# Patient Record
Sex: Male | Born: 1945 | Race: White | Hispanic: No | Marital: Married | Smoking: Never smoker
Health system: Southern US, Community
[De-identification: ages and names within clinical notes are randomized; demographics above are authoritative.]

## PROBLEM LIST (undated history)

## (undated) DIAGNOSIS — E7800 Pure hypercholesterolemia, unspecified: Secondary | ICD-10-CM

## (undated) DIAGNOSIS — K219 Gastro-esophageal reflux disease without esophagitis: Secondary | ICD-10-CM

## (undated) DIAGNOSIS — I10 Essential (primary) hypertension: Secondary | ICD-10-CM

---

## 2004-12-29 ENCOUNTER — Other Ambulatory Visit: Payer: Self-pay

## 2004-12-29 ENCOUNTER — Emergency Department: Payer: Self-pay | Admitting: Unknown Physician Specialty

## 2006-12-09 ENCOUNTER — Ambulatory Visit: Payer: Self-pay | Admitting: Internal Medicine

## 2006-12-14 ENCOUNTER — Ambulatory Visit: Payer: Self-pay | Admitting: Emergency Medicine

## 2008-09-01 IMAGING — CR SKULL - 1-3 VIEW
1 series · 3 of 3 positions shown · non-contrast
Comparison: none

REASON FOR EXAM: rule out auto mirrow glass fb
COMMENTS:

[Series 1: view not recorded · 0.17mm/px · 3 of 3 slices shown]
[im 1/3]
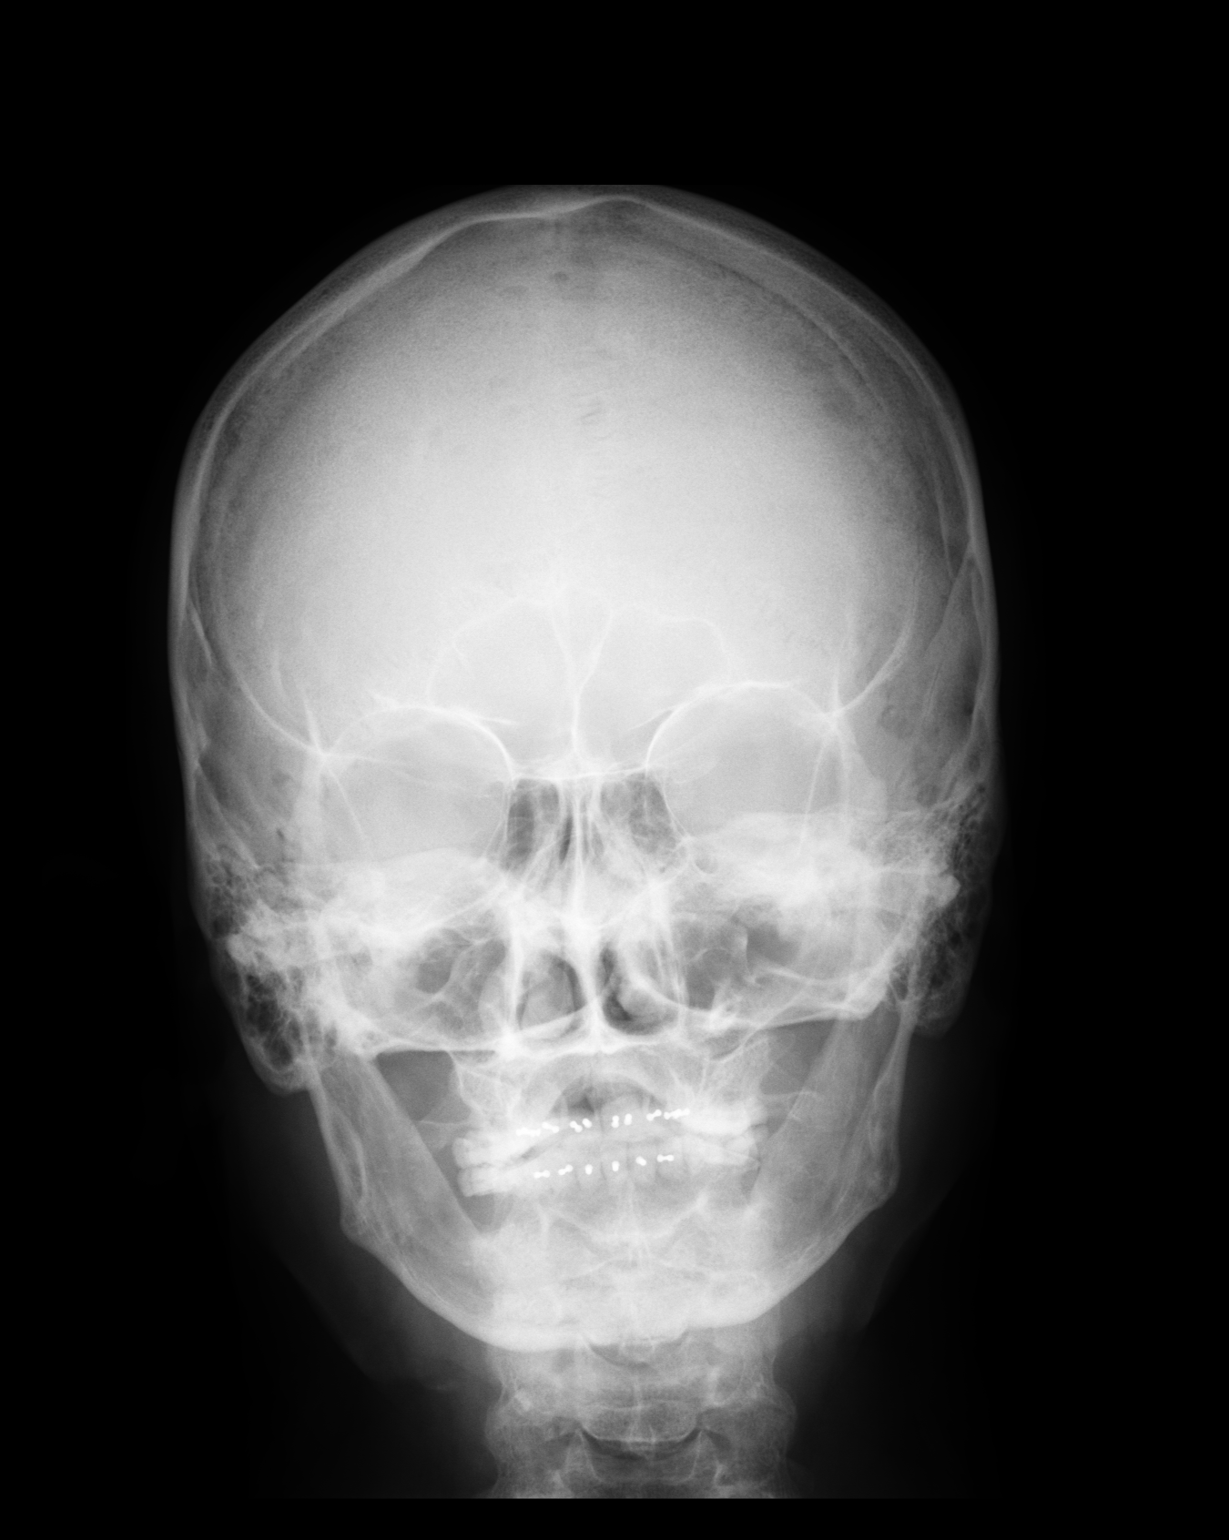
[im 2/3]
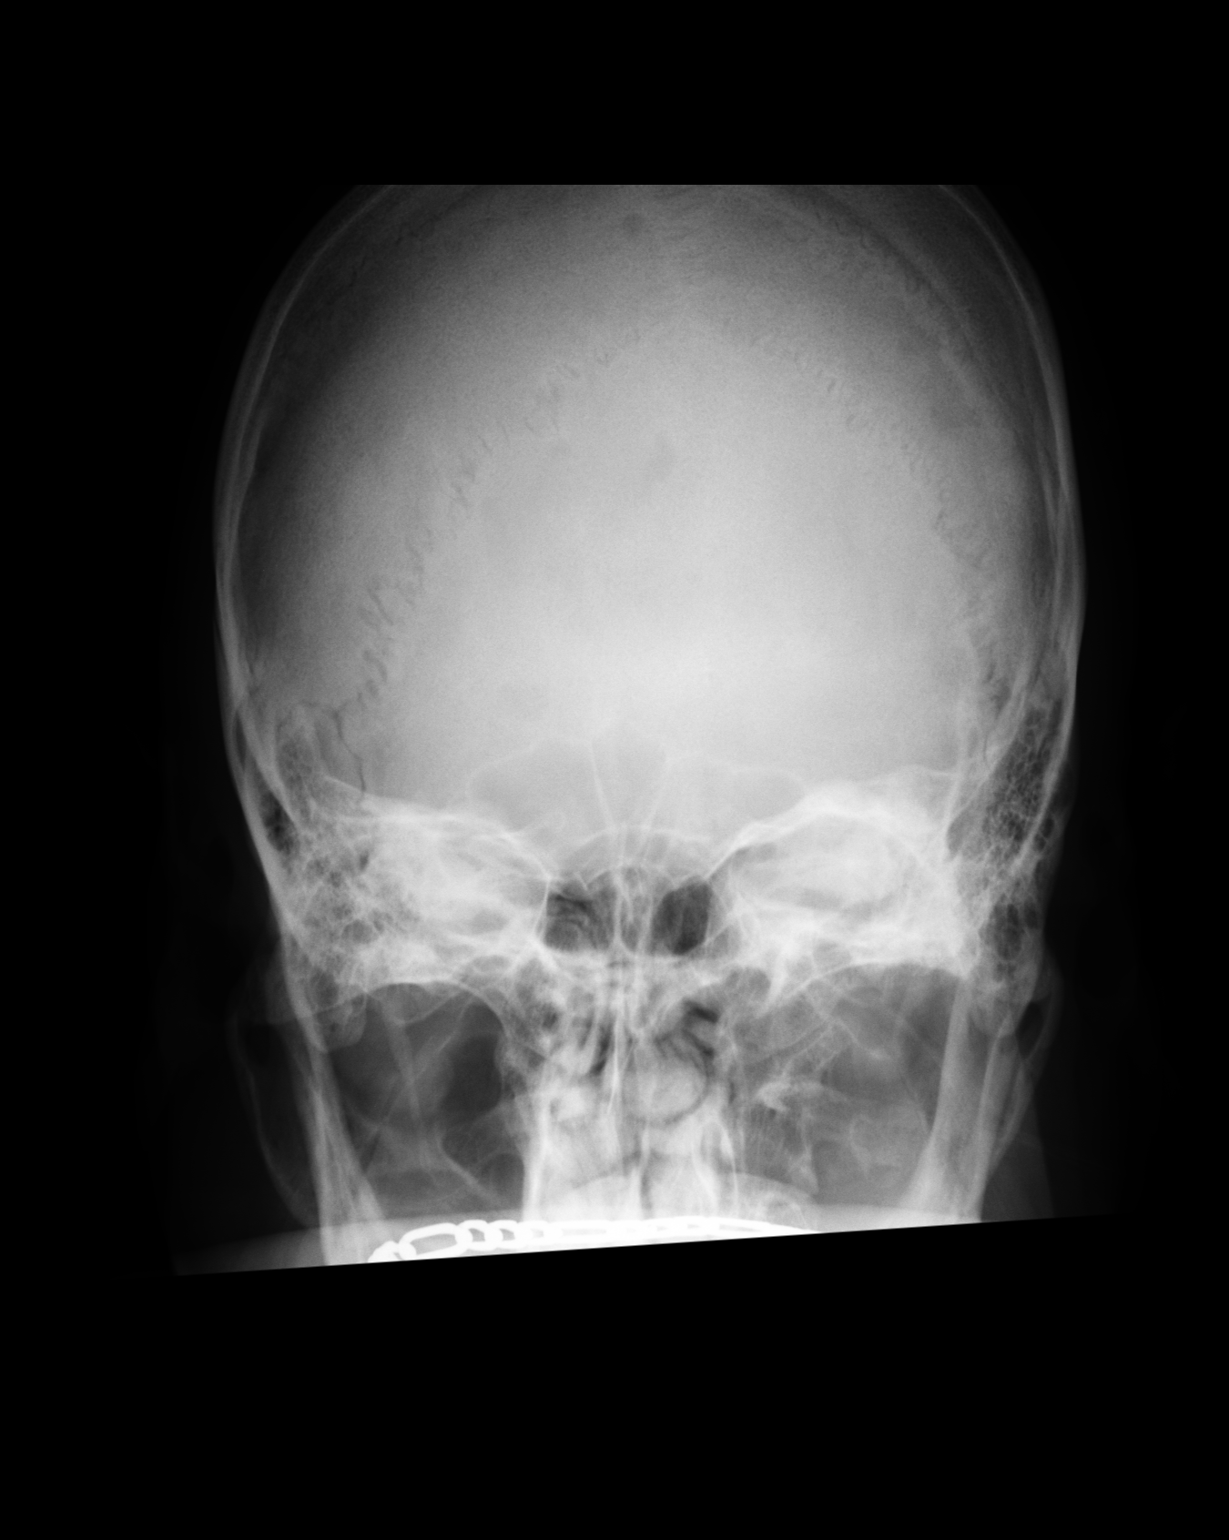
[im 3/3]
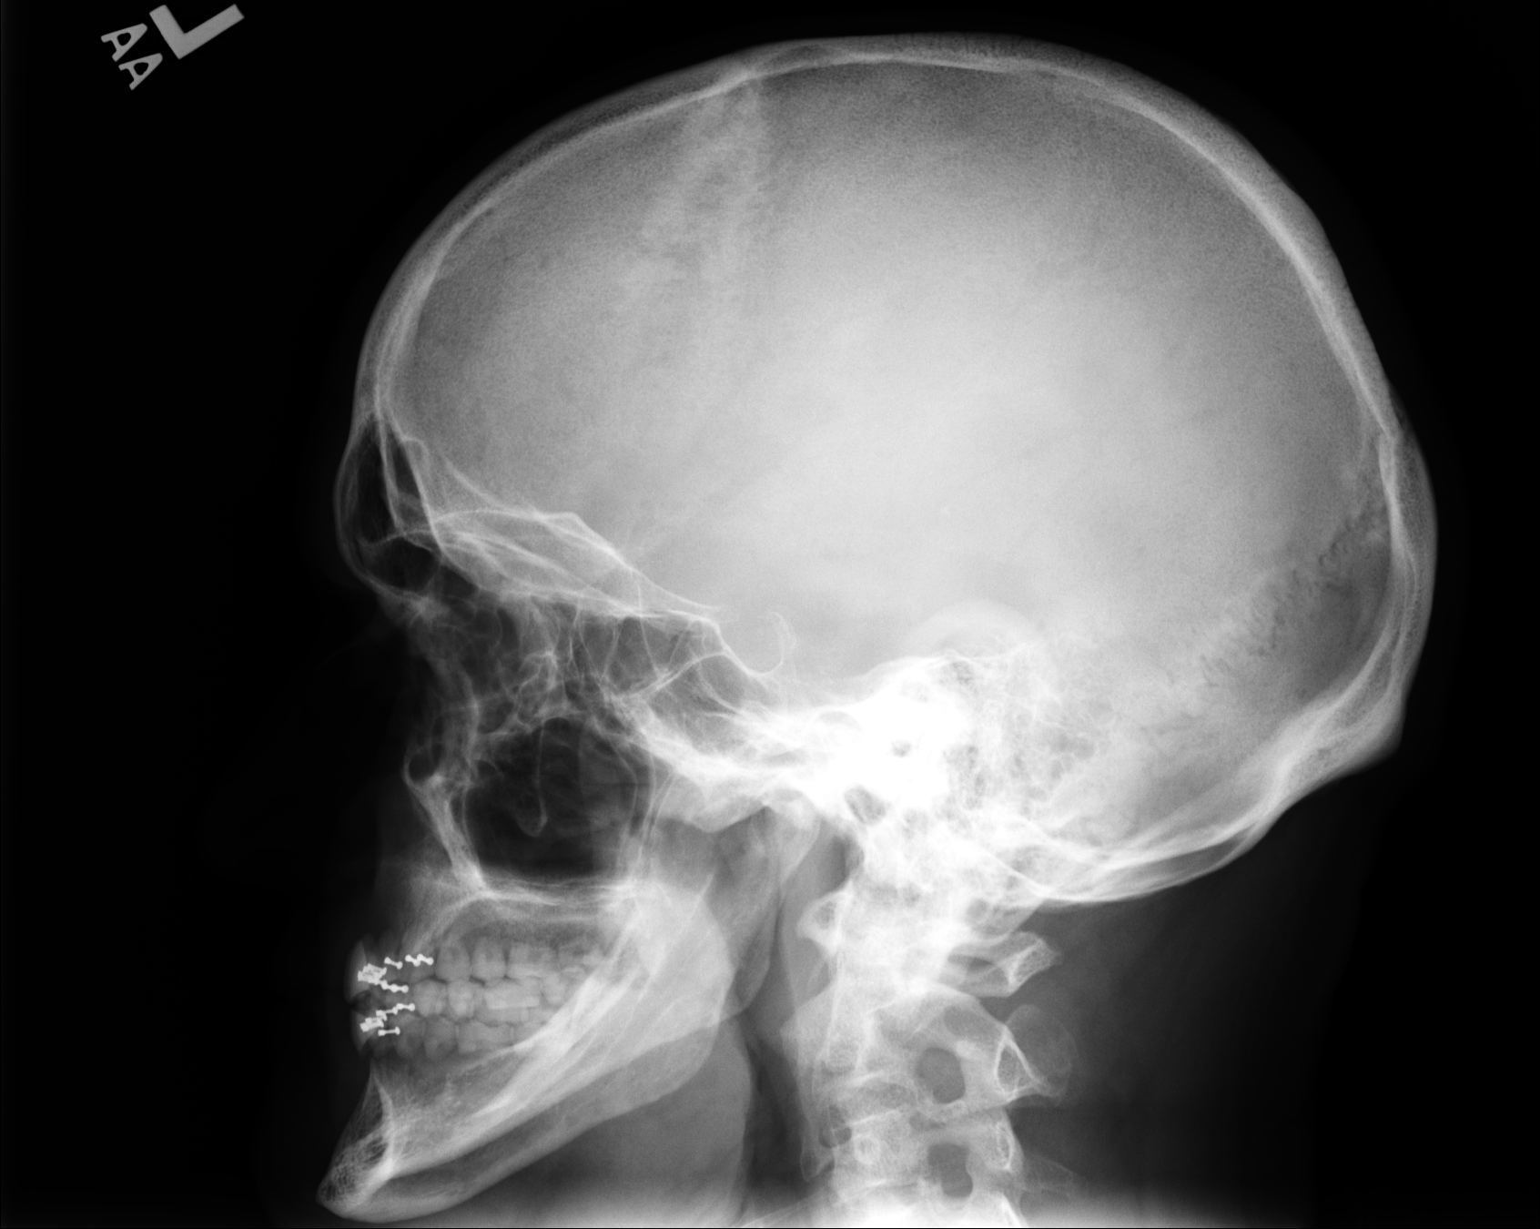

[3 of 3 positions shown; findings below may reference images not displayed]

PROCEDURE:     MDR - MDR SKULL PARTIAL  - December 09, 2006  [DATE]

RESULT:     Three views of the skull were obtained. No fracture or other
acute bony abnormality is seen. Attention to the soft tissue shows no
definite soft tissue foreign body. The views were obtained at skull
technique and the soft tissues are somewhat overpenetrated. Consequently, a
small or poorly opacified foreign body could be present and not detected. If
symptomatology persists, a follow-up examination with soft tissue technique
could be performed to further evaluate the soft tissues about the skull.
IMPRESSION: 1. No significant abnormalities are noted.
2. No foreign body is observed in the soft tissues but the soft tissues are
somewhat overpenetrated and therefore follow-up exam with soft tissue
technique would be recommended if symptomatology persists.

## 2010-07-15 ENCOUNTER — Emergency Department: Payer: Self-pay | Admitting: Emergency Medicine

## 2012-04-07 IMAGING — CR DG CHEST 1V PORT
1 series · 1 of 1 positions shown · non-contrast
Comparison: none

REASON FOR EXAM: chest pain
COMMENTS:

PROCEDURE:     DXR - DXR PORTABLE CHEST SINGLE VIEW  - July 15, 2010  [DATE]
RESULT:     Comparison: None.

[view not recorded]
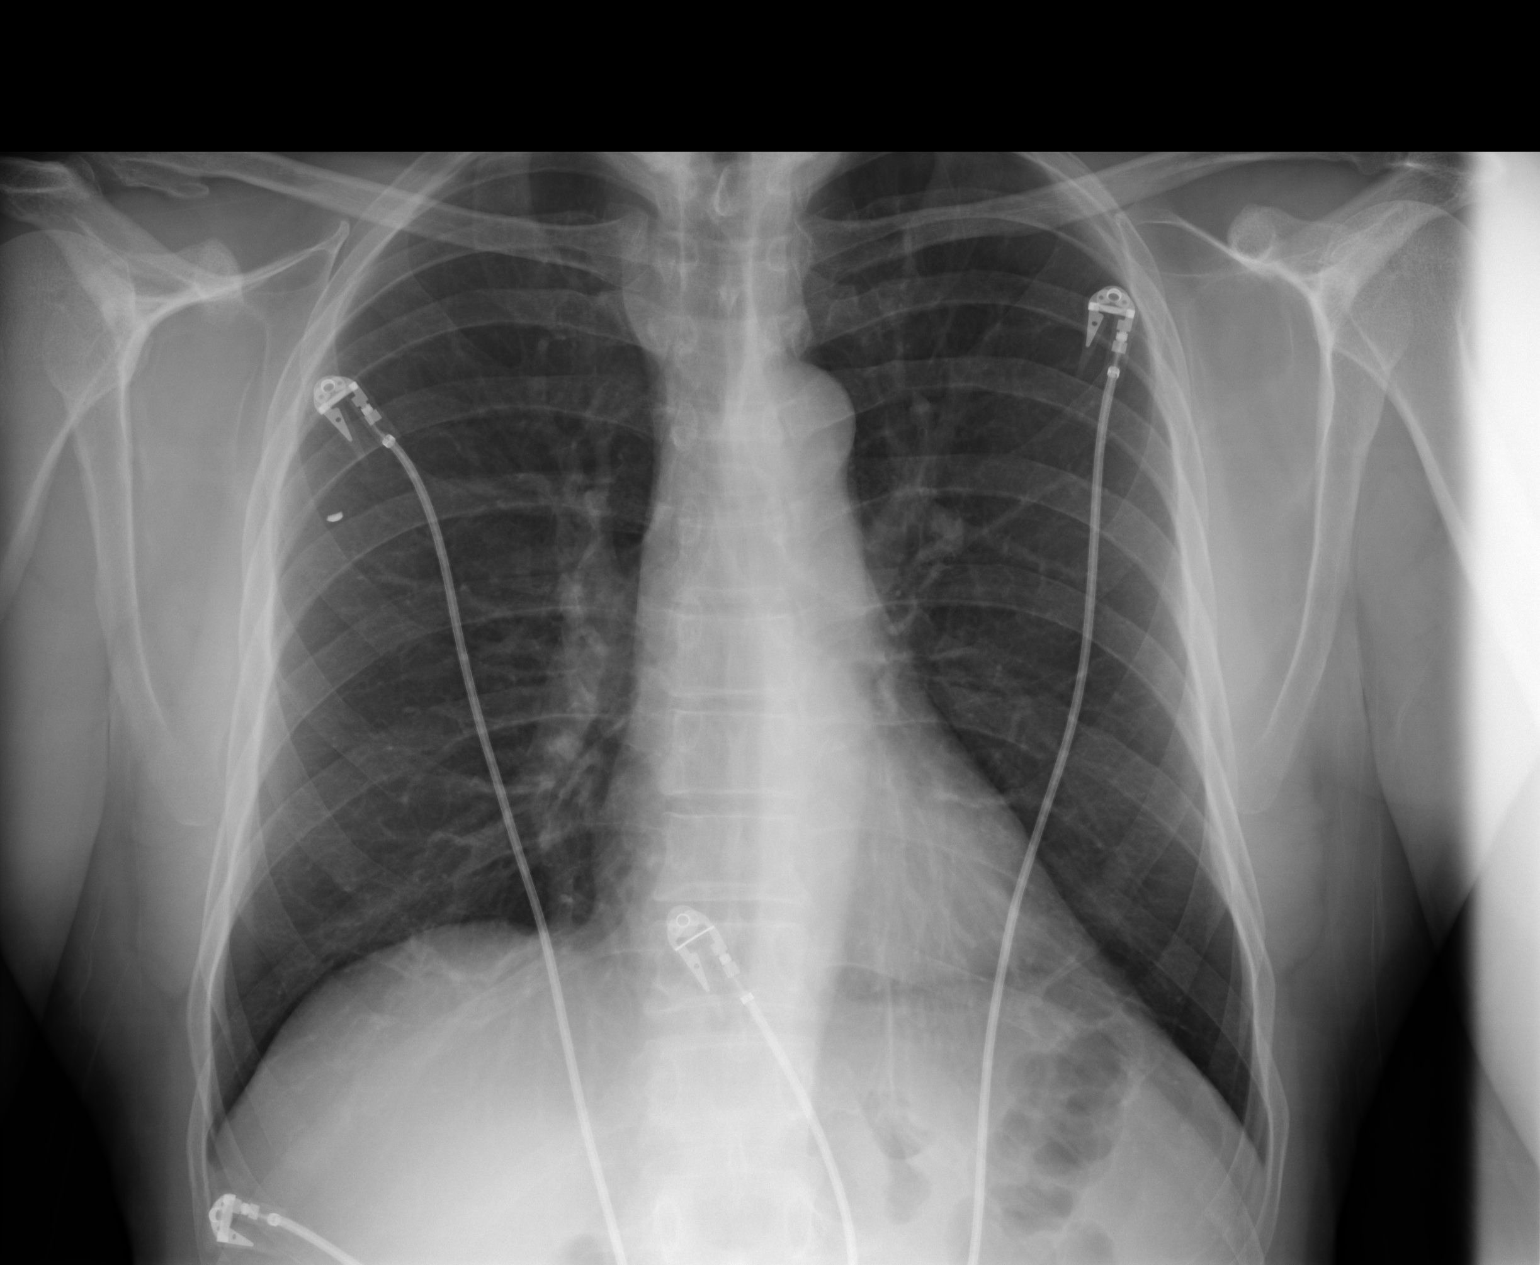

[1 of 1 positions shown; findings below may reference images not displayed]

FINDINGS: Heart normal size. There is mild prominence of right superior mediastinum
likely related to tortuous great vessels. The lungs are clear. Small round
density just superior to the left hilum is felt to represent a vessel on
end. There is a small metallic density overlying the lateral right
hemithorax which may be artifactual or external to patient. Correlate
clinically.
IMPRESSION: 1. No acute cardio pulmonary disease.
2. Mild prominence the right superior mediastinum likely to tortuous great
vessels.  However, recommend comparison with outside prior chest radiographs
and/or followup PA and lateral chest radiographs to ensure stability.

## 2012-04-07 IMAGING — CT CT HEAD WITHOUT CONTRAST
2 series · 16 of 30 positions shown, 20 images · non-contrast
Comparison: none

REASON FOR EXAM: hypertension, dizziness
COMMENTS:

PROCEDURE:     CT  - CT HEAD WITHOUT CONTRAST  - July 15, 2010  [DATE]
RESULT:     History: Dizziness and high blood pressure.
Comparison Study: No recent.

[Series 2: without · axial · non-contrast · 0.39mm/px · z∈[+396,+516]mm · 13 of 29 slices shown, 17 images]
[im 3/29  brain]
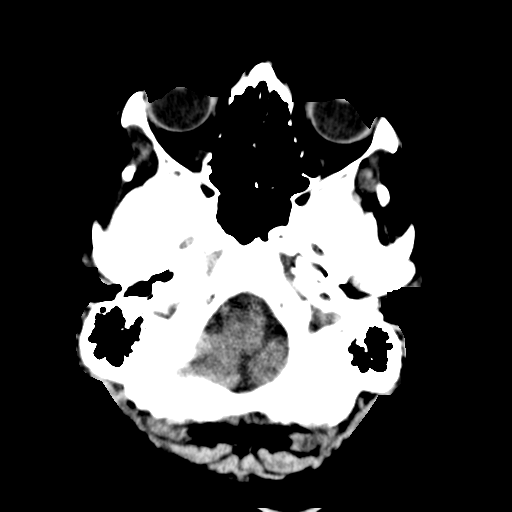
[im 3/29  bone]
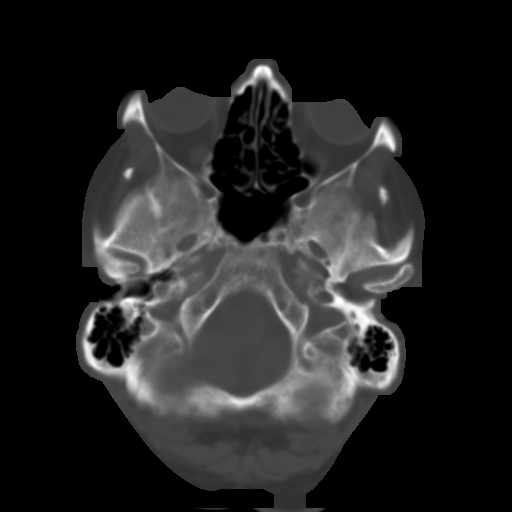
[im 5/29  brain]
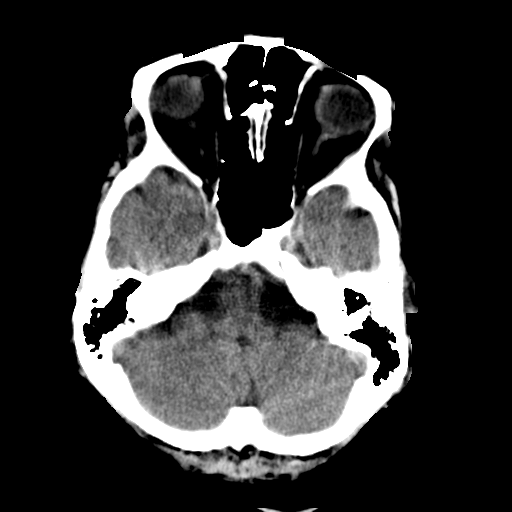
[im 7/29  brain]
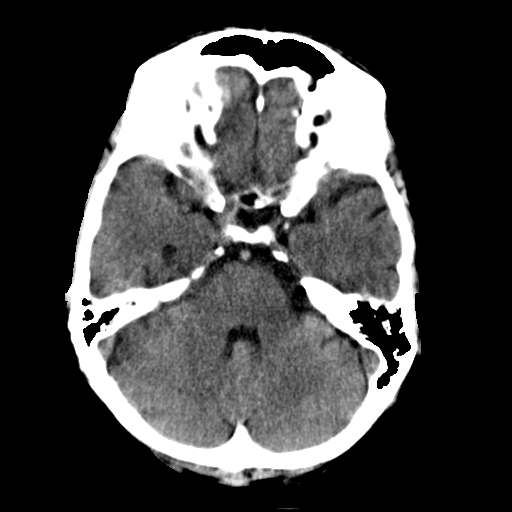
[im 9/29  brain]
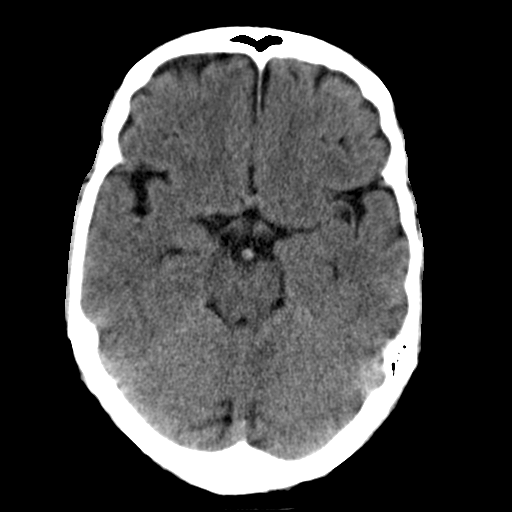
[im 11/29  brain]
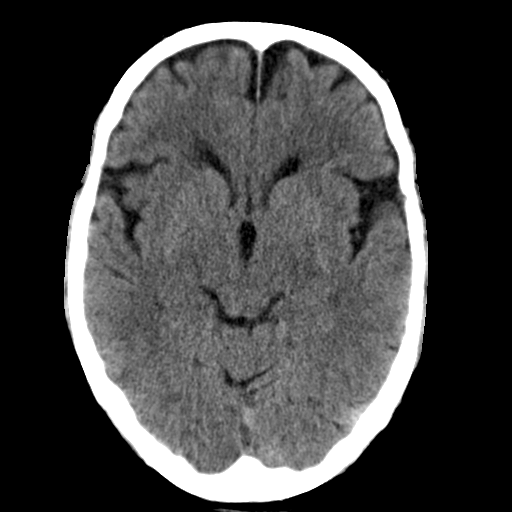
[im 11/29  bone]
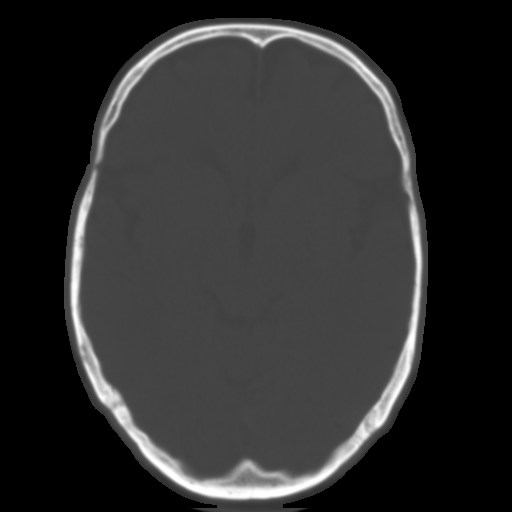
[im 13/29  brain]
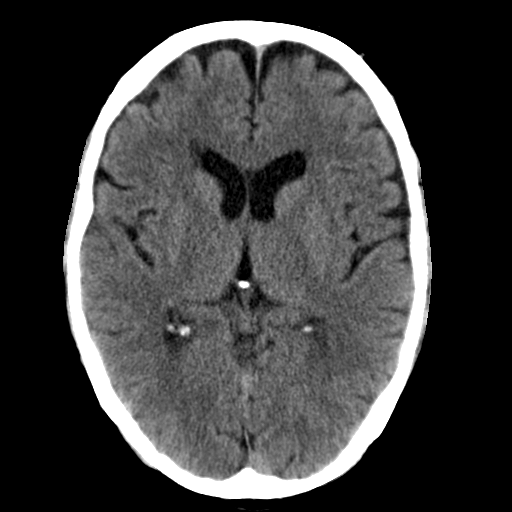
[im 15/29  brain]
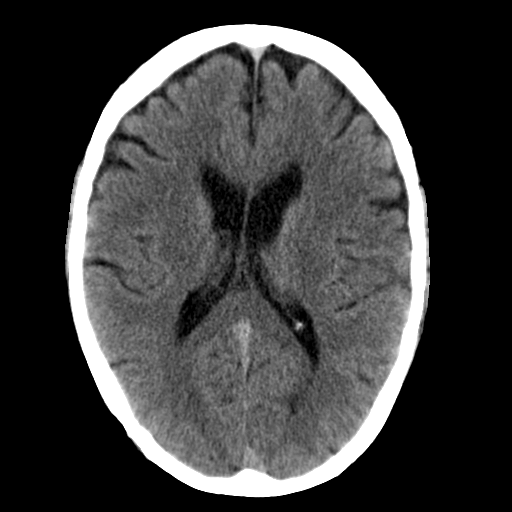
[im 17/29  brain]
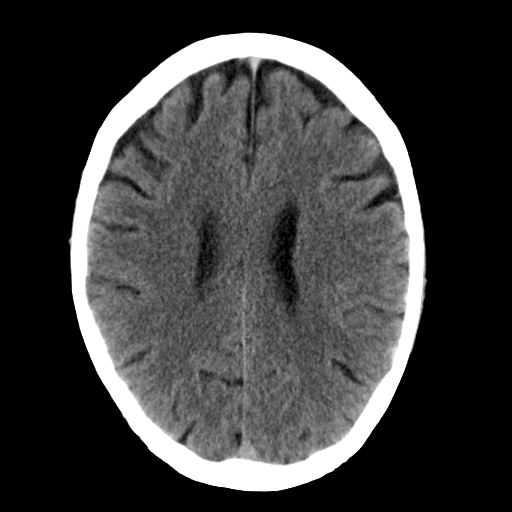
[im 19/29  brain]
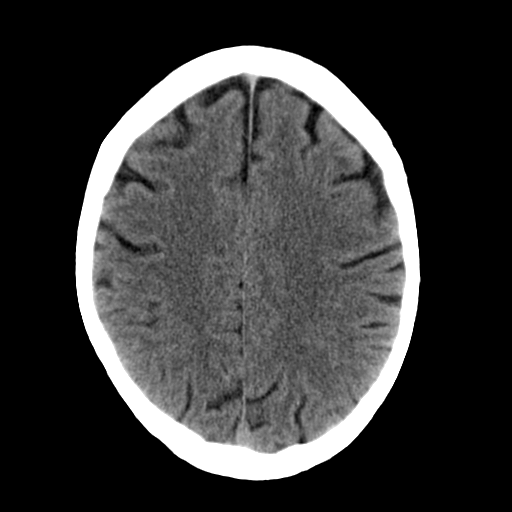
[im 19/29  bone]
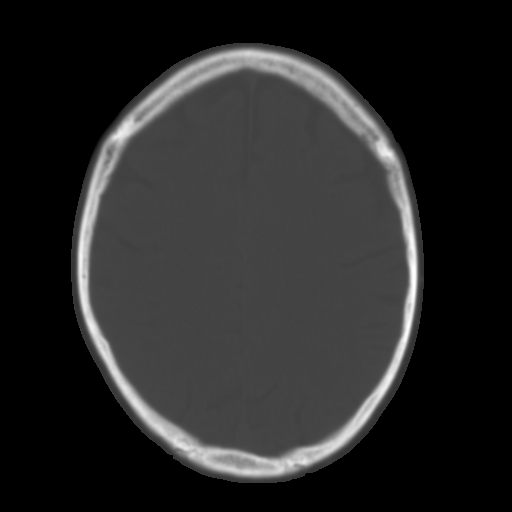
[im 21/29  brain]
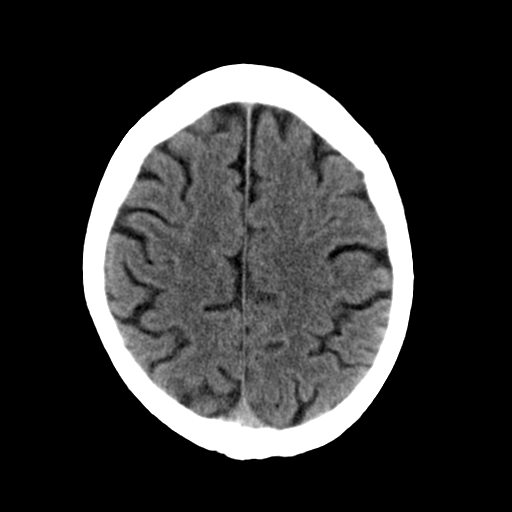
[im 23/29  brain]
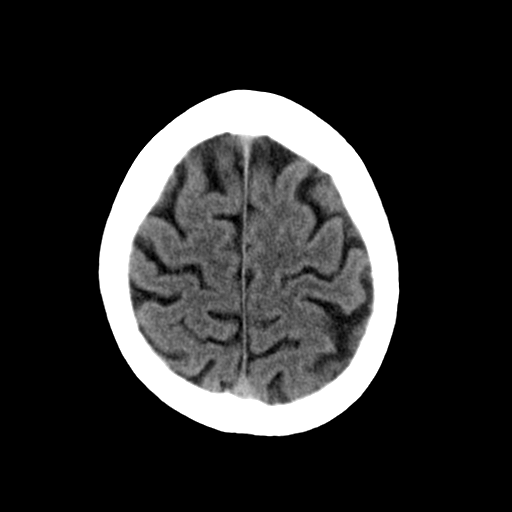
[im 25/29  brain]
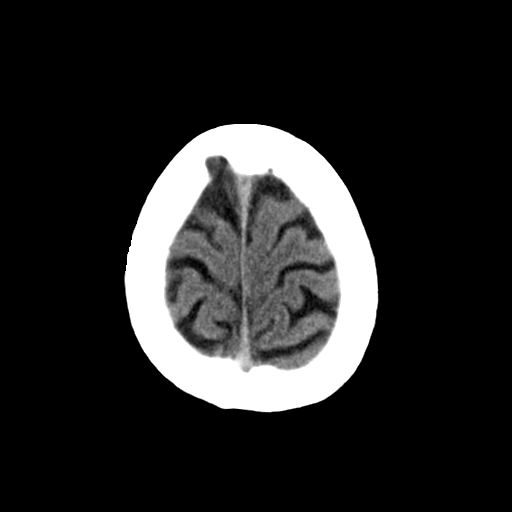
[im 27/29  brain]
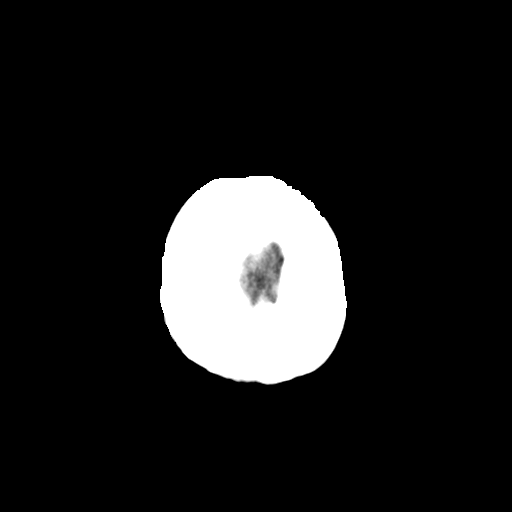
[im 27/29  bone]
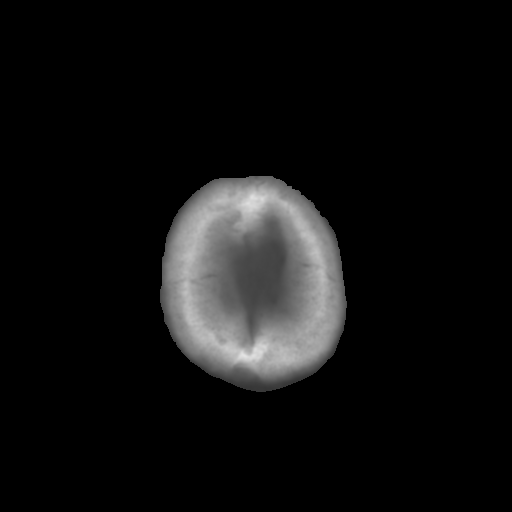

[Series 3: bone · axial · 0.39mm/px · z∈[+396,+436]mm · 3 of 29 slices shown]
[im 3/29  bone]
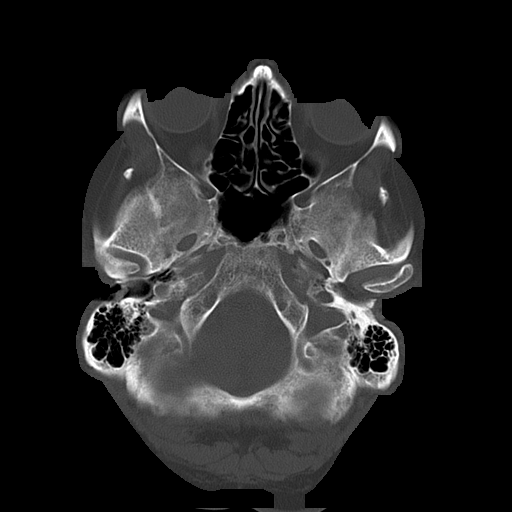
[im 7/29  bone]
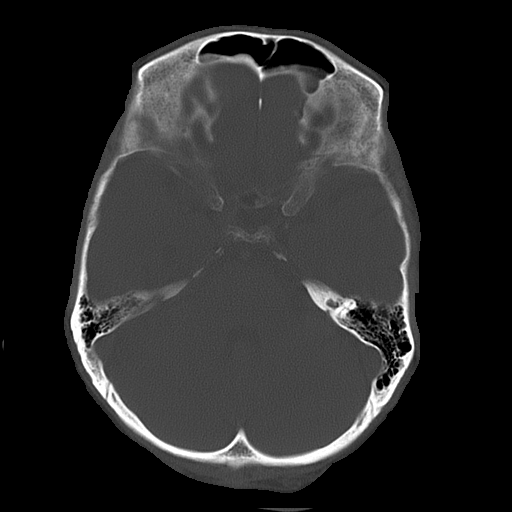
[im 11/29  bone]
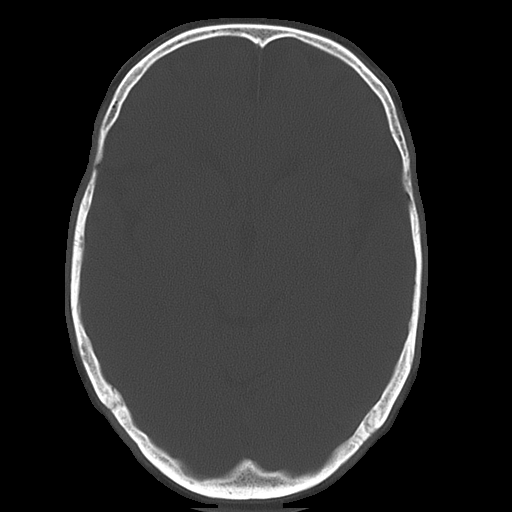

[16 of 30 positions shown; findings below may reference images not displayed]

FINDINGS: No mass. No hydrocephalus. No hemorrhage. No acute bony
abnormality identified.
IMPRESSION: No acute abnormality.

## 2013-06-12 ENCOUNTER — Ambulatory Visit: Payer: Self-pay | Admitting: Gastroenterology

## 2013-06-15 LAB — PATHOLOGY REPORT

## 2017-11-05 ENCOUNTER — Encounter: Payer: Self-pay | Admitting: *Deleted

## 2017-11-12 ENCOUNTER — Ambulatory Visit
Admission: RE | Admit: 2017-11-12 | Discharge: 2017-11-12 | Disposition: A | Discharge status: Home / Self Care | Payer: Medicare Other | Source: Ambulatory Visit | Attending: Ophthalmology | Admitting: Ophthalmology

## 2017-11-12 ENCOUNTER — Ambulatory Visit: Payer: Medicare Other | Admitting: Anesthesiology

## 2017-11-12 ENCOUNTER — Encounter
Admission: RE | Disposition: A | Discharge status: Home / Self Care | Payer: Self-pay | Source: Ambulatory Visit | Attending: Ophthalmology

## 2017-11-12 ENCOUNTER — Other Ambulatory Visit: Payer: Self-pay

## 2017-11-12 ENCOUNTER — Encounter: Payer: Self-pay | Admitting: Anesthesiology

## 2017-11-12 DIAGNOSIS — K219 Gastro-esophageal reflux disease without esophagitis: Secondary | ICD-10-CM | POA: Insufficient documentation

## 2017-11-12 DIAGNOSIS — E7800 Pure hypercholesterolemia, unspecified: Secondary | ICD-10-CM | POA: Insufficient documentation

## 2017-11-12 DIAGNOSIS — I10 Essential (primary) hypertension: Secondary | ICD-10-CM | POA: Insufficient documentation

## 2017-11-12 DIAGNOSIS — H2511 Age-related nuclear cataract, right eye: Secondary | ICD-10-CM | POA: Insufficient documentation

## 2017-11-12 DIAGNOSIS — Z79899 Other long term (current) drug therapy: Secondary | ICD-10-CM | POA: Insufficient documentation

## 2017-11-12 HISTORY — DX: Essential (primary) hypertension: I10

## 2017-11-12 HISTORY — PX: CATARACT EXTRACTION W/PHACO: SHX586

## 2017-11-12 HISTORY — DX: Pure hypercholesterolemia, unspecified: E78.00

## 2017-11-12 HISTORY — DX: Gastro-esophageal reflux disease without esophagitis: K21.9

## 2017-11-12 SURGERY — PHACOEMULSIFICATION, CATARACT, WITH IOL INSERTION
Anesthesia: Monitor Anesthesia Care | Site: Eye | Laterality: Right | Wound class: "Clean "

## 2017-11-12 MED ORDER — TETRACAINE HCL 0.5 % OP SOLN: 1.0000 [drp] | Freq: Once | OPHTHALMIC | Status: DC

## 2017-11-12 MED ORDER — ARMC OPHTHALMIC DILATING DROPS
1.0000 "application " | OPHTHALMIC | Status: AC
  Administered 2017-11-12 (×3): 1 via OPHTHALMIC

## 2017-11-12 MED ORDER — LIDOCAINE HCL (PF) 4 % IJ SOLN
INTRAOCULAR | Status: DC | PRN
  Administered 2017-11-12: 2 mL via OPHTHALMIC

## 2017-11-12 MED ORDER — EPINEPHRINE PF 1 MG/ML IJ SOLN
INTRAOCULAR | Status: DC | PRN
  Administered 2017-11-12: 1 mL via OPHTHALMIC

## 2017-11-12 MED ORDER — MOXIFLOXACIN HCL 0.5 % OP SOLN
OPHTHALMIC | Status: DC | PRN
  Administered 2017-11-12: .2 mL via OPHTHALMIC

## 2017-11-12 MED ORDER — PHENYLEPHRINE HCL 10 % OP SOLN: 1.0000 [drp] | OPHTHALMIC | Status: DC | PRN

## 2017-11-12 MED ORDER — ARMC OPHTHALMIC DILATING DROPS
OPHTHALMIC | Status: AC
  Administered 2017-11-12: 1 via OPHTHALMIC
  Filled 2017-11-12: qty 0.5

## 2017-11-12 MED ORDER — MIDAZOLAM HCL 2 MG/2ML IJ SOLN
INTRAMUSCULAR | Status: AC
  Filled 2017-11-12: qty 2

## 2017-11-12 MED ORDER — MOXIFLOXACIN HCL 0.5 % OP SOLN
OPHTHALMIC | Status: AC
  Filled 2017-11-12: qty 3

## 2017-11-12 MED ORDER — MIDAZOLAM HCL 2 MG/2ML IJ SOLN
INTRAMUSCULAR | Status: DC | PRN
  Administered 2017-11-12 (×2): 1 mg via INTRAVENOUS

## 2017-11-12 MED ORDER — POVIDONE-IODINE 5 % OP SOLN
OPHTHALMIC | Status: DC | PRN
  Administered 2017-11-12: 1 via OPHTHALMIC

## 2017-11-12 MED ORDER — MOXIFLOXACIN HCL 0.5 % OP SOLN: 1.0000 [drp] | Freq: Once | OPHTHALMIC | Status: DC

## 2017-11-12 MED ORDER — SODIUM CHLORIDE 0.9 % IV SOLN
INTRAVENOUS | Status: DC
  Administered 2017-11-12: 11:00:00 via INTRAVENOUS

## 2017-11-12 MED ORDER — CARBACHOL 0.01 % IO SOLN
INTRAOCULAR | Status: DC | PRN
  Administered 2017-11-12: .5 mL via INTRAOCULAR

## 2017-11-12 MED ORDER — NA CHONDROIT SULF-NA HYALURON 40-17 MG/ML IO SOLN
INTRAOCULAR | Status: DC | PRN
  Administered 2017-11-12: 1 mL via INTRAOCULAR

## 2017-11-12 SURGICAL SUPPLY — 16 items
GLOVE BIO SURGEON STRL SZ8 (GLOVE) ×3 IMPLANT
GLOVE BIOGEL M 6.5 STRL (GLOVE) ×3 IMPLANT
GLOVE SURG LX 8.0 MICRO (GLOVE) ×2
GLOVE SURG LX STRL 8.0 MICRO (GLOVE) ×1 IMPLANT
GOWN STRL REUS W/ TWL LRG LVL3 (GOWN DISPOSABLE) ×2 IMPLANT
GOWN STRL REUS W/TWL LRG LVL3 (GOWN DISPOSABLE) ×4
LABEL CATARACT MEDS ST (LABEL) ×3 IMPLANT
LENS IOL TECNIS ITEC 20.5 (Intraocular Lens) ×2 IMPLANT
PACK CATARACT (MISCELLANEOUS) ×3 IMPLANT
PACK CATARACT BRASINGTON LX (MISCELLANEOUS) ×3 IMPLANT
PACK EYE AFTER SURG (MISCELLANEOUS) ×3 IMPLANT
SOL BSS BAG (MISCELLANEOUS) ×3
SOLUTION BSS BAG (MISCELLANEOUS) ×1 IMPLANT
SYR 5ML LL (SYRINGE) ×3 IMPLANT
WATER STERILE IRR 250ML POUR (IV SOLUTION) ×3 IMPLANT
WIPE NON LINTING 3.25X3.25 (MISCELLANEOUS) ×3 IMPLANT

## 2017-11-12 NOTE — Op Note (Signed)
PREOPERATIVE DIAGNOSIS:  Nuclear sclerotic cataract of the right eye.   POSTOPERATIVE DIAGNOSIS:  nuclear sclerotic cataract right eye   OPERATIVE PROCEDURE: Procedure(s): CATARACT EXTRACTION PHACO AND INTRAOCULAR LENS PLACEMENT (IOC)   SURGEON:  Birder Robson, MD.   ANESTHESIA:  Anesthesiologist: Gunnar Bulla, MD CRNA: Eben Burow, CRNA  1.      Managed anesthesia care. 2.      0.71ml of Shugarcaine was instilled in the eye following the paracentesis.   COMPLICATIONS:  None.   TECHNIQUE:   Stop and chop   DESCRIPTION OF PROCEDURE:  The patient was examined and consented in the preoperative holding area where the aforementioned topical anesthesia was applied to the right eye and then brought back to the Operating Room where the right eye was prepped and draped in the usual sterile ophthalmic fashion and a lid speculum was placed. A paracentesis was created with the side port blade and the anterior chamber was filled with viscoelastic. A near clear corneal incision was performed with the steel keratome. A continuous curvilinear capsulorrhexis was performed with a cystotome followed by the capsulorrhexis forceps. Hydrodissection and hydrodelineation were carried out with BSS on a blunt cannula. The lens was removed in a stop and chop  technique and the remaining cortical material was removed with the irrigation-aspiration handpiece. The capsular bag was inflated with viscoelastic and the Technis ZCB00  lens was placed in the capsular bag without complication. The remaining viscoelastic was removed from the eye with the irrigation-aspiration handpiece. The wounds were hydrated. The anterior chamber was flushed with Miostat and the eye was inflated to physiologic pressure. 0.74ml of Vigamox was placed in the anterior chamber. The wounds were found to be water tight. The eye was dressed with Vigamox. The patient was given protective glasses to wear throughout the day and a shield with which to  sleep tonight. The patient was also given drops with which to begin a drop regimen today and will follow-up with me in one day. Implant Name Type Inv. Item Serial No. Manufacturer Lot No. LRB No. Used  LENS IOL DIOP 20.5 - [id number] Intraocular Lens LENS IOL DIOP 20.5 [id number] AMO  Right 1   Procedure(s) with comments: CATARACT EXTRACTION PHACO AND INTRAOCULAR LENS PLACEMENT ([facility]) (Right) - [place] 00:36 AP% 16.9 CDE 6.23 Fluid pack lot # [id number]  Electronically signed: Birder Robson 11/12/2017 11:13 AM

## 2017-11-12 NOTE — H&P (Signed)
All labs reviewed. Abnormal studies sent to patients PCP when indicated.  Previous H&P reviewed, patient examined, there are NO CHANGES.  Joel Najjar Porfilio9/10/201911:12 AM

## 2017-11-12 NOTE — Anesthesia Preprocedure Evaluation (Addendum)
Anesthesia Evaluation  Patient identified by MRN, date of birth, ID band Patient awake    Reviewed: Allergy & Precautions, NPO status , Patient's Chart, lab work & pertinent test results, reviewed documented beta blocker date and time   Airway Mallampati: II  TM Distance: >3 FB     Dental  (+) Upper Dentures, Lower Dentures   Pulmonary           Cardiovascular hypertension, Pt. on medications      Neuro/Psych    GI/Hepatic   Endo/Other    Renal/GU      Musculoskeletal   Abdominal   Peds  Hematology   Anesthesia Other Findings   Reproductive/Obstetrics                            Anesthesia Physical Anesthesia Plan  ASA: II  Anesthesia Plan: MAC   Post-op Pain Management:    Induction:   PONV Risk Score and Plan:   Airway Management Planned:   Additional Equipment:   Intra-op Plan:   Post-operative Plan:   Informed Consent: I have reviewed the patients History and Physical, chart, labs and discussed the procedure including the risks, benefits and alternatives for the proposed anesthesia with the patient or authorized representative who has indicated his/her understanding and acceptance.     Plan Discussed with: CRNA  Anesthesia Plan Comments:         Anesthesia Quick Evaluation

## 2017-11-12 NOTE — Transfer of Care (Signed)
Immediate Anesthesia Transfer of Care Note  Patient: Joel Patterson  Procedure(s) Performed: CATARACT EXTRACTION PHACO AND INTRAOCULAR LENS PLACEMENT ([facility]) (Right Eye)  Patient Location: Short Stay  Anesthesia Type:MAC  Level of Consciousness: awake, alert , oriented and patient cooperative  Airway & Oxygen Therapy: Patient Spontanous Breathing  Post-op Assessment: Report given to RN and Post -op Vital signs reviewed and stable  Post vital signs: Reviewed and stable  Last Vitals:  Vitals Value Taken Time  BP    Temp    Pulse    Resp    SpO2      Last Pain:  Vitals:   11/12/17 1038  TempSrc: Temporal  PainSc: 0-No pain         Complications: No apparent anesthesia complications

## 2017-11-12 NOTE — Anesthesia Postprocedure Evaluation (Signed)
Anesthesia Post Note  Patient: Joel Patterson  Procedure(s) Performed: CATARACT EXTRACTION PHACO AND INTRAOCULAR LENS PLACEMENT ([facility]) (Right Eye)  Patient location during evaluation: Short Stay Anesthesia Type: MAC Level of consciousness: awake and alert, oriented and patient cooperative Pain management: pain level controlled Vital Signs Assessment: post-procedure vital signs reviewed and stable Respiratory status: spontaneous breathing, respiratory function stable and nonlabored ventilation Cardiovascular status: blood pressure returned to baseline and stable Postop Assessment: no headache and no apparent nausea or vomiting Anesthetic complications: no     Last Vitals:  Vitals:   11/12/17 1038 11/12/17 1114  BP: (!) 163/85 (!) 142/72  Pulse: (!) 48 (!) 49  Resp: 17 16  Temp: (!) 35.7 C (!) 36.2 C  SpO2: 99% 99%    Last Pain:  Vitals:   11/12/17 1114  TempSrc: Temporal  PainSc: 0-No pain                 Eben Burow

## 2017-11-12 NOTE — Anesthesia Post-op Follow-up Note (Signed)
Anesthesia QCDR form completed.        

## 2017-11-12 NOTE — Discharge Instructions (Addendum)
Eye Surgery Discharge Instructions  Expect mild scratchy sensation or mild soreness. DO NOT RUB YOUR EYE!  The day of surgery:  Minimal physical activity, but bed rest is not required  No reading, computer work, or close hand work  No bending, lifting, or straining.  May watch TV  For 24 hours:  No driving, legal decisions, or alcoholic beverages  Safety precautions  Eat anything you prefer: It is better to start with liquids, then soup then solid foods.  Solar shield eyeglasses should be worn for comfort in the sunlight/patch while sleeping  Resume all regular medications including aspirin or Coumadin if these were discontinued prior to surgery. You may shower, bathe, shave, or wash your hair. Tylenol may be taken for mild discomfort. FOLLOW DR. PORFILIO'S EYE DROP INSTRUCTION SHEET AS REVIEWED.  Call your doctor if you experience significant pain, nausea, or vomiting, fever > 101 or other signs of infection. [phone] or [phone] Specific instructions:  Follow-up Information    Birder Robson, MD Follow up.   Specialty:  Ophthalmology Why:  11/13/17 @ 10:10 am Contact information: [address] [place] [address] [phone]

## 2017-11-13 ENCOUNTER — Encounter: Payer: Self-pay | Admitting: Ophthalmology

## 2018-01-01 ENCOUNTER — Encounter: Payer: Self-pay | Admitting: *Deleted

## 2018-01-07 ENCOUNTER — Encounter
Admission: RE | Disposition: A | Discharge status: Home / Self Care | Payer: Self-pay | Source: Ambulatory Visit | Attending: Ophthalmology

## 2018-01-07 ENCOUNTER — Ambulatory Visit: Payer: Medicare Other | Admitting: Anesthesiology

## 2018-01-07 ENCOUNTER — Other Ambulatory Visit: Payer: Self-pay

## 2018-01-07 ENCOUNTER — Ambulatory Visit
Admission: RE | Admit: 2018-01-07 | Discharge: 2018-01-07 | Disposition: A | Discharge status: Home / Self Care | Payer: Medicare Other | Source: Ambulatory Visit | Attending: Ophthalmology | Admitting: Ophthalmology

## 2018-01-07 DIAGNOSIS — I10 Essential (primary) hypertension: Secondary | ICD-10-CM | POA: Insufficient documentation

## 2018-01-07 DIAGNOSIS — K219 Gastro-esophageal reflux disease without esophagitis: Secondary | ICD-10-CM | POA: Diagnosis not present

## 2018-01-07 DIAGNOSIS — Z9841 Cataract extraction status, right eye: Secondary | ICD-10-CM | POA: Insufficient documentation

## 2018-01-07 DIAGNOSIS — Z79899 Other long term (current) drug therapy: Secondary | ICD-10-CM | POA: Insufficient documentation

## 2018-01-07 DIAGNOSIS — H2512 Age-related nuclear cataract, left eye: Secondary | ICD-10-CM | POA: Diagnosis not present

## 2018-01-07 DIAGNOSIS — E7800 Pure hypercholesterolemia, unspecified: Secondary | ICD-10-CM | POA: Insufficient documentation

## 2018-01-07 HISTORY — PX: CATARACT EXTRACTION W/PHACO: SHX586

## 2018-01-07 SURGERY — PHACOEMULSIFICATION, CATARACT, WITH IOL INSERTION
Anesthesia: Monitor Anesthesia Care | Site: Eye | Laterality: Left

## 2018-01-07 MED ORDER — EPINEPHRINE PF 1 MG/ML IJ SOLN
INTRAMUSCULAR | Status: AC
  Filled 2018-01-07: qty 1

## 2018-01-07 MED ORDER — LIDOCAINE HCL (PF) 4 % IJ SOLN
INTRAMUSCULAR | Status: AC
  Filled 2018-01-07: qty 5

## 2018-01-07 MED ORDER — LIDOCAINE HCL (PF) 4 % IJ SOLN
INTRAOCULAR | Status: DC | PRN
  Administered 2018-01-07: 2 mL via OPHTHALMIC

## 2018-01-07 MED ORDER — POVIDONE-IODINE 5 % OP SOLN
OPHTHALMIC | Status: DC | PRN
  Administered 2018-01-07: 1 via OPHTHALMIC

## 2018-01-07 MED ORDER — MOXIFLOXACIN HCL 0.5 % OP SOLN: 1.0000 [drp] | OPHTHALMIC | Status: DC | PRN

## 2018-01-07 MED ORDER — MIDAZOLAM HCL 2 MG/2ML IJ SOLN
INTRAMUSCULAR | Status: DC | PRN
  Administered 2018-01-07 (×2): 1 mg via INTRAVENOUS

## 2018-01-07 MED ORDER — SODIUM CHLORIDE 0.9 % IV SOLN
INTRAVENOUS | Status: DC
  Administered 2018-01-07: 10:00:00 via INTRAVENOUS

## 2018-01-07 MED ORDER — MIDAZOLAM HCL 2 MG/2ML IJ SOLN
INTRAMUSCULAR | Status: AC
  Filled 2018-01-07: qty 2

## 2018-01-07 MED ORDER — CARBACHOL 0.01 % IO SOLN
INTRAOCULAR | Status: DC | PRN
  Administered 2018-01-07: .5 mL via INTRAOCULAR

## 2018-01-07 MED ORDER — NA CHONDROIT SULF-NA HYALURON 40-17 MG/ML IO SOLN
INTRAOCULAR | Status: DC | PRN
  Administered 2018-01-07: 1 mL via INTRAOCULAR

## 2018-01-07 MED ORDER — ARMC OPHTHALMIC DILATING DROPS
OPHTHALMIC | Status: AC
  Administered 2018-01-07: 1 via OPHTHALMIC
  Filled 2018-01-07: qty 0.5

## 2018-01-07 MED ORDER — TETRACAINE HCL 0.5 % OP SOLN
OPHTHALMIC | Status: AC
  Administered 2018-01-07: 1 [drp] via OPHTHALMIC
  Filled 2018-01-07: qty 4

## 2018-01-07 MED ORDER — POVIDONE-IODINE 5 % OP SOLN
OPHTHALMIC | Status: AC
  Filled 2018-01-07: qty 30

## 2018-01-07 MED ORDER — MOXIFLOXACIN HCL 0.5 % OP SOLN
OPHTHALMIC | Status: AC
  Filled 2018-01-07: qty 3

## 2018-01-07 MED ORDER — TETRACAINE HCL 0.5 % OP SOLN
1.0000 [drp] | OPHTHALMIC | Status: AC | PRN
  Administered 2018-01-07 (×3): 1 [drp] via OPHTHALMIC

## 2018-01-07 MED ORDER — EPINEPHRINE PF 1 MG/ML IJ SOLN
INTRAOCULAR | Status: DC | PRN
  Administered 2018-01-07: 1 mL via OPHTHALMIC

## 2018-01-07 MED ORDER — MOXIFLOXACIN HCL 0.5 % OP SOLN
OPHTHALMIC | Status: DC | PRN
  Administered 2018-01-07: .2 mL via OPHTHALMIC

## 2018-01-07 MED ORDER — NA CHONDROIT SULF-NA HYALURON 40-17 MG/ML IO SOLN
INTRAOCULAR | Status: AC
  Filled 2018-01-07: qty 1

## 2018-01-07 MED ORDER — ARMC OPHTHALMIC DILATING DROPS
1.0000 "application " | OPHTHALMIC | Status: AC
  Administered 2018-01-07 (×3): 1 via OPHTHALMIC

## 2018-01-07 SURGICAL SUPPLY — 16 items

## 2018-01-07 NOTE — Anesthesia Preprocedure Evaluation (Signed)
Anesthesia Evaluation  Patient identified by MRN, date of birth, ID band Patient awake    Reviewed: Allergy & Precautions, NPO status , Patient's Chart, lab work & pertinent test results  History of Anesthesia Complications Negative for: history of anesthetic complications  Airway Mallampati: II  TM Distance: >3 FB Neck ROM: Full    Dental  (+) Edentulous Upper, Edentulous Lower   Pulmonary neg pulmonary ROS, neg sleep apnea, neg COPD,    breath sounds clear to auscultation- rhonchi (-) wheezing      Cardiovascular Exercise Tolerance: Good hypertension, Pt. on medications (-) CAD, (-) Past MI, (-) Cardiac Stents and (-) CABG  Rhythm:Regular Rate:Normal - Systolic murmurs and - Diastolic murmurs    Neuro/Psych negative neurological ROS  negative psych ROS   GI/Hepatic Neg liver ROS, GERD  ,  Endo/Other  negative endocrine ROSneg diabetes  Renal/GU negative Renal ROS     Musculoskeletal negative musculoskeletal ROS (+)   Abdominal (+) - obese,   Peds  Hematology negative hematology ROS (+)   Anesthesia Other Findings Past Medical History: No date: GERD (gastroesophageal reflux disease) No date: Hypercholesteremia No date: Hypertension   Reproductive/Obstetrics                             Anesthesia Physical Anesthesia Plan  ASA: II  Anesthesia Plan: MAC   Post-op Pain Management:    Induction: Intravenous  PONV Risk Score and Plan: 1 and Midazolam  Airway Management Planned: Natural Airway  Additional Equipment:   Intra-op Plan:   Post-operative Plan:   Informed Consent: I have reviewed the patients History and Physical, chart, labs and discussed the procedure including the risks, benefits and alternatives for the proposed anesthesia with the patient or authorized representative who has indicated his/her understanding and acceptance.     Plan Discussed with: CRNA and  Anesthesiologist  Anesthesia Plan Comments:         Anesthesia Quick Evaluation

## 2018-01-07 NOTE — H&P (Signed)
All labs reviewed. Abnormal studies sent to patients PCP when indicated.  Previous H&P reviewed, patient examined, there are NO CHANGES.  Joel Chaput Porfilio11/5/201910:49 AM

## 2018-01-07 NOTE — Discharge Instructions (Addendum)
Eye Surgery Discharge Instructions  Expect mild scratchy sensation or mild soreness. DO NOT RUB YOUR EYE!  The day of surgery:  Minimal physical activity, but bed rest is not required  No reading, computer work, or close hand work  No bending, lifting, or straining.  May watch TV  For 24 hours:  No driving, legal decisions, or alcoholic beverages  Safety precautions  Eat anything you prefer: It is better to start with liquids, then soup then solid foods.  Solar shield eyeglasses should be worn for comfort in the sunlight/patch while sleeping  Resume all regular medications including aspirin or Coumadin if these were discontinued prior to surgery. You may shower, bathe, shave, or wash your hair. Tylenol may be taken for mild discomfort. FOLLOW EYE DROP INSTRUCTION SHEET AS REVIEWED. Call your doctor if you experience significant pain, nausea, or vomiting, fever > 101 or other signs of infection. [phone] or [phone] Specific instructions:  Follow-up Information    Birder Robson, MD Follow up.   Specialty:  Ophthalmology Why:  01/08/18 @ 10:40 am Contact information: [place] [address] [phone]

## 2018-01-07 NOTE — Op Note (Signed)
PREOPERATIVE DIAGNOSIS:  Nuclear sclerotic cataract of the left eye.   POSTOPERATIVE DIAGNOSIS:  Nuclear sclerotic cataract of the left eye.   OPERATIVE PROCEDURE: Procedure(s): CATARACT EXTRACTION PHACO AND INTRAOCULAR LENS PLACEMENT (IOC)   SURGEON:  Birder Robson, MD.   ANESTHESIA:  Anesthesiologist: Emmie Niemann, MD CRNA: Jonna Clark, CRNA  1.      Managed anesthesia care. 2.     0.70ml of Shugarcaine was instilled following the paracentesis   COMPLICATIONS:  None.   TECHNIQUE:   Stop and chop   DESCRIPTION OF PROCEDURE:  The patient was examined and consented in the preoperative holding area where the aforementioned topical anesthesia was applied to the left eye and then brought back to the Operating Room where the left eye was prepped and draped in the usual sterile ophthalmic fashion and a lid speculum was placed. A paracentesis was created with the side port blade and the anterior chamber was filled with viscoelastic. A near clear corneal incision was performed with the steel keratome. A continuous curvilinear capsulorrhexis was performed with a cystotome followed by the capsulorrhexis forceps. Hydrodissection and hydrodelineation were carried out with BSS on a blunt cannula. The lens was removed in a stop and chop  technique and the remaining cortical material was removed with the irrigation-aspiration handpiece. The capsular bag was inflated with viscoelastic and the Technis [id number] lens was placed in the capsular bag without complication. The remaining viscoelastic was removed from the eye with the irrigation-aspiration handpiece. The wounds were hydrated. The anterior chamber was flushed with Miostat and the eye was inflated to physiologic pressure. 0.72ml Vigamox was placed in the anterior chamber. The wounds were found to be water tight. The eye was dressed with Vigamox. The patient was given protective glasses to wear throughout the day and a shield with which to sleep  tonight. The patient was also given drops with which to begin a drop regimen today and will follow-up with me in one day. Implant Name Type Inv. Item Serial No. Manufacturer Lot No. LRB No. Used  LENS IOL DIOP 21.0 - [id number] Intraocular Lens LENS IOL DIOP 21.0 [id number] AMO  Left 1    Procedure(s) with comments: CATARACT EXTRACTION PHACO AND INTRAOCULAR LENS PLACEMENT (IOC) (Left) - [place]  00:30 CDE 3.02 Fluid pack lot # [id number]  Electronically signed: Birder Robson 01/07/2018 11:13 AM

## 2018-01-07 NOTE — Transfer of Care (Signed)
Immediate Anesthesia Transfer of Care Note  Patient: Joel Patterson  Procedure(s) Performed: CATARACT EXTRACTION PHACO AND INTRAOCULAR LENS PLACEMENT ([place]) (Left Eye)  Patient Location: Short Stay  Anesthesia Type:MAC  Level of Consciousness: awake, alert  and oriented  Airway & Oxygen Therapy: Patient Spontanous Breathing  Post-op Assessment: Report given to RN and Post -op Vital signs reviewed and stable  Post vital signs: Reviewed and stable  Last Vitals:  Vitals Value Taken Time  BP 154/79 01/07/2018 11:14 AM  Temp 36.9 C 01/07/2018 11:14 AM  Pulse 51 01/07/2018 11:14 AM  Resp 16 01/07/2018 11:14 AM  SpO2 100 % 01/07/2018 11:14 AM    Last Pain:  Vitals:   01/07/18 1114  TempSrc: Oral  PainSc: 0-No pain         Complications: No apparent anesthesia complications

## 2018-01-07 NOTE — Anesthesia Postprocedure Evaluation (Signed)
Anesthesia Post Note  Patient: Joel Patterson  Procedure(s) Performed: CATARACT EXTRACTION PHACO AND INTRAOCULAR LENS PLACEMENT ([place]) (Left Eye)  Patient location during evaluation: Short Stay Anesthesia Type: MAC Level of consciousness: awake and alert and oriented Pain management: pain level controlled Vital Signs Assessment: post-procedure vital signs reviewed and stable Respiratory status: spontaneous breathing Cardiovascular status: stable Postop Assessment: no headache and adequate PO intake Anesthetic complications: no     Last Vitals:  Vitals:   01/07/18 1007 01/07/18 1114  BP: (!) 165/76 (!) 154/79  Pulse: (!) 54 (!) 51  Resp: 16 16  Temp: (!) 36.2 C 36.9 C  SpO2: 100% 100%    Last Pain:  Vitals:   01/07/18 1114  TempSrc: Oral  PainSc: 0-No pain                 Lanora Manis

## 2018-01-07 NOTE — Anesthesia Post-op Follow-up Note (Signed)
Anesthesia QCDR form completed.        

## 2020-06-24 ENCOUNTER — Encounter: Payer: Self-pay | Admitting: Emergency Medicine

## 2020-06-24 ENCOUNTER — Other Ambulatory Visit: Payer: Self-pay

## 2020-06-24 ENCOUNTER — Emergency Department
Admission: EM | Admit: 2020-06-24 | Discharge: 2020-06-25 | Disposition: A | Discharge status: Home / Self Care | Payer: Medicare Other | Attending: Emergency Medicine | Admitting: Emergency Medicine

## 2020-06-24 DIAGNOSIS — R55 Syncope and collapse: Secondary | ICD-10-CM | POA: Insufficient documentation

## 2020-06-24 DIAGNOSIS — R42 Dizziness and giddiness: Secondary | ICD-10-CM | POA: Insufficient documentation

## 2020-06-24 DIAGNOSIS — I10 Essential (primary) hypertension: Secondary | ICD-10-CM | POA: Diagnosis not present

## 2020-06-24 DIAGNOSIS — R0789 Other chest pain: Secondary | ICD-10-CM | POA: Insufficient documentation

## 2020-06-24 DIAGNOSIS — R1030 Lower abdominal pain, unspecified: Secondary | ICD-10-CM | POA: Diagnosis not present

## 2020-06-24 DIAGNOSIS — R61 Generalized hyperhidrosis: Secondary | ICD-10-CM | POA: Insufficient documentation

## 2020-06-24 DIAGNOSIS — Z79899 Other long term (current) drug therapy: Secondary | ICD-10-CM | POA: Diagnosis not present

## 2020-06-24 LAB — COMPREHENSIVE METABOLIC PANEL
ALT: 45 U/L — ABNORMAL HIGH (ref 0–44)
AST: 36 U/L (ref 15–41)
Albumin: 4.1 g/dL (ref 3.5–5.0)
Alkaline Phosphatase: 71 U/L (ref 38–126)
Anion gap: 9 (ref 5–15)
BUN: 12 mg/dL (ref 8–23)
CO2: 24 mmol/L (ref 22–32)
Calcium: 8.5 mg/dL — ABNORMAL LOW (ref 8.9–10.3)
Chloride: 106 mmol/L (ref 98–111)
Creatinine, Ser: 1.18 mg/dL (ref 0.61–1.24)
GFR, Estimated: >60 mL/min (ref >60)
Glucose, Bld: 141 mg/dL — ABNORMAL HIGH (ref 70–99)
Potassium: 3.8 mmol/L (ref 3.5–5.1)
Sodium: 139 mmol/L (ref 135–145)
Total Bilirubin: 0.6 mg/dL (ref 0.3–1.2)
Total Protein: 6.8 g/dL (ref 6.5–8.1)

## 2020-06-24 LAB — CBC
HCT: 39.9 % (ref 39.0–52.0)
Hemoglobin: 13.4 g/dL (ref 13.0–17.0)
MCH: 30.6 pg (ref 26.0–34.0)
MCHC: 33.6 g/dL (ref 30.0–36.0)
MCV: 91.1 fL (ref 80.0–100.0)
Platelets: 272 10*3/uL (ref 150–400)
RBC: 4.38 MIL/uL (ref 4.22–5.81)
RDW: 13.4 % (ref 11.5–15.5)
WBC: 7.7 10*3/uL (ref 4.0–10.5)
nRBC: 0 % (ref 0.0–0.2)

## 2020-06-24 LAB — TROPONIN I (HIGH SENSITIVITY): Troponin I (High Sensitivity): 4 ng/L (ref <18)

## 2020-06-24 MED ORDER — SODIUM CHLORIDE 0.9 % IV BOLUS
1000.0000 mL | Freq: Once | INTRAVENOUS | Status: AC
  Administered 2020-06-24: 1000 mL via INTRAVENOUS

## 2020-06-24 NOTE — ED Triage Notes (Signed)
Pt reports being caught by friends after feeling hot and sweaty, denies headstrike    And  Remembers passing out

## 2020-06-24 NOTE — ED Provider Notes (Signed)
[facility] Emergency Department Provider Note  ____________________________________________   I have reviewed the triage vital signs and the nursing notes.   HISTORY  Chief Complaint Near Syncope and Loss of Consciousness   History limited by: Not Limited   HPI Joel Patterson is a 75 y.o. male who presents to the emergency department today because of concern for a syncopal episode. The patient states that he was at a party when he started feeling hot, sweating and started feeling lightheaded. He then passed out. He was helped down to the ground by a family member. The patient denies any chest pain or palpitations. The patient states that he had passed out one time many years ago. Was outside working a lot today and does not think he was keeping up with his hydration. Denies any new medications.   Records reviewed. Per medical record review patient has a history of HLD, HTN.   Past Medical History:  Diagnosis Date  . GERD (gastroesophageal reflux disease)   . Hypercholesteremia   . Hypertension     There are no problems to display for this patient.   Past Surgical History:  Procedure Laterality Date  . CATARACT EXTRACTION W/PHACO Right 11/12/2017   Procedure: CATARACT EXTRACTION PHACO AND INTRAOCULAR LENS PLACEMENT (IOC);  Surgeon: Birder Robson, MD;  Location: [facility] ORS;  Service: Ophthalmology;  Laterality: Right;  [place] 00:36 AP% 16.9 CDE 6.23 Fluid pack lot # [id number]  . CATARACT EXTRACTION W/PHACO Left 01/07/2018   Procedure: CATARACT EXTRACTION PHACO AND INTRAOCULAR LENS PLACEMENT ([place]);  Surgeon: Birder Robson, MD;  Location: [facility] ORS;  Service: Ophthalmology;  Laterality: Left;  [place]  00:30 CDE 3.02 Fluid pack lot # [id number]    Prior to Admission medications   Medication Sig Start Date End Date Taking? Authorizing Provider  Aspirin-Caffeine (BC FAST PAIN RELIEF ARTHRITIS PO) Take 1 packet by mouth daily.    [provider]   atorvastatin (LIPITOR) 40 MG tablet Take 20 mg by mouth daily.     [provider]  lisinopril (PRINIVIL,ZESTRIL) 10 MG tablet Take 10 mg by mouth at bedtime.    [provider]  omeprazole (PRILOSEC) 20 MG capsule Take 20 mg by mouth at bedtime.    [provider]    Allergies Patient has no known allergies.  History reviewed. No pertinent family history.  Social History Social History   Tobacco Use  . Smoking status: Never Smoker  . Smokeless tobacco: Never Used  Substance Use Topics  . Alcohol use: Never    Review of Systems Constitutional: No fever/chills Eyes: No visual changes. ENT: No sore throat. Cardiovascular: Denies chest pain. Respiratory: Denies shortness of breath. Gastrointestinal: No abdominal pain.  No nausea, no vomiting.  No diarrhea.   Genitourinary: Negative for dysuria. Musculoskeletal: Negative for back pain. Skin: Positive for sweating.  Neurological: Positive for lightheadedness and passing out.  ____________________________________________   PHYSICAL EXAM:  VITAL SIGNS: ED Triage Vitals  Enc Vitals Group     BP 06/24/20 2018 (!) 145/87     Pulse Rate 06/24/20 2018 62     Resp 06/24/20 2018 19     Temp 06/24/20 2018 98.2 F (36.8 C)     Temp Source 06/24/20 2018 Oral     SpO2 06/24/20 2018 96 %     Weight 06/24/20 2019 150 lb (68 kg)     Height 06/24/20 2019 5\' 5"  (1.651 m)     Head Circumference --      Peak  Flow --      Pain Score 06/24/20 2019 0   Constitutional: Alert and oriented.  Eyes: Conjunctivae are normal.  ENT      Head: Normocephalic and atraumatic.      Nose: No congestion/rhinnorhea.      Mouth/Throat: Mucous membranes are moist.      Neck: No stridor. Hematological/Lymphatic/Immunilogical: No cervical lymphadenopathy. Cardiovascular: Normal rate, regular rhythm.  No murmurs, rubs, or gallops.  Respiratory: Normal respiratory effort without tachypnea nor retractions. Breath sounds are  clear and equal bilaterally. No wheezes/rales/rhonchi. Gastrointestinal: Soft and non tender. No rebound. No guarding.  Genitourinary: Deferred Musculoskeletal: Normal range of motion in all extremities. No lower extremity edema. Neurologic:  Normal speech and language. No gross focal neurologic deficits are appreciated.  Skin:  Skin is warm, dry and intact. No rash noted. Psychiatric: Mood and affect are normal. Speech and behavior are normal. Patient exhibits appropriate insight and judgment.  ____________________________________________    LABS (pertinent positives/negatives)  CBC wbc 7.7, hgb 13.4, plt 272 CMP wnl except glu 141, ca 8.5, alt 45  ____________________________________________   EKG  I, Nance Pear, attending physician, personally viewed and interpreted this EKG  EKG Time: 2016 Rate: 62 Rhythm: sinus rhythm Axis: normal Intervals: qtc 433 QRS: narrow, q waves v1, III ST changes: no st elevation Impression: abnormal ekg  ____________________________________________    RADIOLOGY  CT ab.pel pending at time of sign out  ____________________________________________   PROCEDURES  Procedures  ____________________________________________   INITIAL IMPRESSION / ASSESSMENT AND PLAN / ED COURSE  Pertinent labs & imaging results that were available during my care of the patient were reviewed by me and considered in my medical decision making (see chart for details).   Patient presented to the emergency department today because of concerns for lower abdominal pain as well as some chest discomfort.  Terms of the lower abdominal pain it is located in the suprapubic area.  Did initially wonder if patient might be suffering from UTI however urine without any obvious signs of infection.  Because of this a CT scan was ordered and this was pending at time of signout.  Additionally patient's chest pain got better here in the emergency department.  2 sets of  troponin were negative.  This point I doubt ACS. ____________________________________________   FINAL CLINICAL IMPRESSION(S) / ED DIAGNOSES  Abdominal pain  Note: This dictation was prepared with Dragon dictation. Any transcriptional errors that result from this process are unintentional     Nance Pear, MD 06/24/20 2359

## 2020-06-24 NOTE — ED Triage Notes (Signed)
Patient from home via [facility] with complaints of "getting sweaty and dizzy then I passed out"   Per EMS pt is non-orthostatic, A&O x 4    History of recent yardwork "I was going hard all day"

## 2020-06-25 LAB — URINALYSIS, COMPLETE (UACMP) WITH MICROSCOPIC
Bacteria, UA: NONE SEEN
Bilirubin Urine: NEGATIVE
Glucose, UA: NEGATIVE mg/dL
Hgb urine dipstick: NEGATIVE
Ketones, ur: NEGATIVE mg/dL
Leukocytes,Ua: NEGATIVE
Nitrite: NEGATIVE
Protein, ur: NEGATIVE mg/dL
Specific Gravity, Urine: 1.016 (ref 1.005–1.030)
Squamous Epithelial / HPF: NONE SEEN (ref 0–5)
pH: 5 (ref 5.0–8.0)

## 2020-06-25 LAB — TROPONIN I (HIGH SENSITIVITY): Troponin I (High Sensitivity): 4 ng/L (ref <18)

## 2020-06-25 NOTE — ED Notes (Signed)
Pt wheeled to lobby  Peripheral IV discontinued. Catheter intact. No signs of infiltration or redness. Gauze applied to IV site.   Discharge instructions reviewed with patient. Questions fielded by this RN. Patient verbalizes understanding of instructions. Patient discharged home in stable condition per bradler. No acute distress noted at time of discharge.

## 2021-11-20 ENCOUNTER — Encounter: Payer: Self-pay | Admitting: Dermatology

## 2021-11-20 ENCOUNTER — Ambulatory Visit: Payer: Medicare Other | Admitting: Dermatology

## 2021-11-20 DIAGNOSIS — L570 Actinic keratosis: Secondary | ICD-10-CM

## 2021-11-20 DIAGNOSIS — D489 Neoplasm of uncertain behavior, unspecified: Secondary | ICD-10-CM

## 2021-11-20 DIAGNOSIS — L814 Other melanin hyperpigmentation: Secondary | ICD-10-CM | POA: Diagnosis not present

## 2021-11-20 DIAGNOSIS — Z1283 Encounter for screening for malignant neoplasm of skin: Secondary | ICD-10-CM | POA: Diagnosis not present

## 2021-11-20 DIAGNOSIS — D1801 Hemangioma of skin and subcutaneous tissue: Secondary | ICD-10-CM

## 2021-11-20 DIAGNOSIS — D229 Melanocytic nevi, unspecified: Secondary | ICD-10-CM

## 2021-11-20 DIAGNOSIS — L820 Inflamed seborrheic keratosis: Secondary | ICD-10-CM | POA: Diagnosis not present

## 2021-11-20 DIAGNOSIS — L821 Other seborrheic keratosis: Secondary | ICD-10-CM

## 2021-11-20 DIAGNOSIS — C44319 Basal cell carcinoma of skin of other parts of face: Secondary | ICD-10-CM | POA: Diagnosis not present

## 2021-11-20 DIAGNOSIS — C44310 Basal cell carcinoma of skin of unspecified parts of face: Secondary | ICD-10-CM

## 2021-11-20 DIAGNOSIS — C4491 Basal cell carcinoma of skin, unspecified: Secondary | ICD-10-CM

## 2021-11-20 DIAGNOSIS — L578 Other skin changes due to chronic exposure to nonionizing radiation: Secondary | ICD-10-CM

## 2021-11-20 HISTORY — DX: Basal cell carcinoma of skin, unspecified: C44.91

## 2021-11-20 NOTE — Patient Instructions (Addendum)
Electrodesiccation and Curettage ("Scrape and Burn") Wound Care Instructions  Leave the original bandage on for 24 hours if possible.  If the bandage becomes soaked or soiled before that time, it is OK to remove it and examine the wound.  A small amount of post-operative bleeding is normal.  If excessive bleeding occurs, remove the bandage, place gauze over the site and apply continuous pressure (no peeking) over the area for 30 minutes. If this does not work, please call our clinic as soon as possible or page your doctor if it is after hours.   Once a day, cleanse the wound with soap and water. It is fine to shower. If a thick crust develops you may use a Q-tip dipped into dilute hydrogen peroxide (mix 1:1 with water) to dissolve it.  Hydrogen peroxide can slow the healing process, so use it only as needed.    After washing, apply petroleum jelly (Vaseline) or an antibiotic ointment if your doctor prescribed one for you, followed by a bandage.    For best healing, the wound should be covered with a layer of ointment at all times. If you are not able to keep the area covered with a bandage to hold the ointment in place, this may mean re-applying the ointment several times a day.  Continue this wound care until the wound has healed and is no longer open. It may take several weeks for the wound to heal and close.  Itching and mild discomfort is normal during the healing process.  If you have any discomfort, you can take Tylenol (acetaminophen) or ibuprofen as directed on the bottle. (Please do not take these if you have an allergy to them or cannot take them for another reason).  Some redness, tenderness and white or yellow material in the wound is normal healing.  If the area becomes very sore and red, or develops a thick yellow-green material (pus), it may be infected; please notify us.    Wound healing continues for up to one year following surgery. It is not unusual to experience pain in the scar  from time to time during the interval.  If the pain becomes severe or the scar thickens, you should notify the office.    A slight amount of redness in a scar is expected for the first six months.  After six months, the redness will fade and the scar will soften and fade.  The color difference becomes less noticeable with time.  If there are any problems, return for a post-op surgery check at your earliest convenience.  To improve the appearance of the scar, you can use silicone scar gel, cream, or sheets (such as Mederma or Serica) every night for up to one year. These are available over the counter (without a prescription).  Please call our office at [phone] for any questions or concerns.  Biopsy Wound Care Instructions  Leave the original bandage on for 24 hours if possible.  If the bandage becomes soaked or soiled before that time, it is OK to remove it and examine the wound.  A small amount of post-operative bleeding is normal.  If excessive bleeding occurs, remove the bandage, place gauze over the site and apply continuous pressure (no peeking) over the area for 30 minutes. If this does not work, please call our clinic as soon as possible or page your doctor if it is after hours.   Once a day, cleanse the wound with soap and water. It is fine to shower. If   a thick crust develops you may use a Q-tip dipped into dilute hydrogen peroxide (mix 1:1 with water) to dissolve it.  Hydrogen peroxide can slow the healing process, so use it only as needed.    After washing, apply petroleum jelly (Vaseline) or an antibiotic ointment if your doctor prescribed one for you, followed by a bandage.    For best healing, the wound should be covered with a layer of ointment at all times. If you are not able to keep the area covered with a bandage to hold the ointment in place, this may mean re-applying the ointment several times a day.  Continue this wound care until the wound has healed and is no longer  open.   Itching and mild discomfort is normal during the healing process. However, if you develop pain or severe itching, please call our office.   If you have any discomfort, you can take Tylenol (acetaminophen) or ibuprofen as directed on the bottle. (Please do not take these if you have an allergy to them or cannot take them for another reason).  Some redness, tenderness and white or yellow material in the wound is normal healing.  If the area becomes very sore and red, or develops a thick yellow-green material (pus), it may be infected; please notify us.    If you have stitches, return to clinic as directed to have the stitches removed. You will continue wound care for 2-3 days after the stitches are removed.   Wound healing continues for up to one year following surgery. It is not unusual to experience pain in the scar from time to time during the interval.  If the pain becomes severe or the scar thickens, you should notify the office.    A slight amount of redness in a scar is expected for the first six months.  After six months, the redness will fade and the scar will soften and fade.  The color difference becomes less noticeable with time.  If there are any problems, return for a post-op surgery check at your earliest convenience.  To improve the appearance of the scar, you can use silicone scar gel, cream, or sheets (such as Mederma or Serica) every night for up to one year. These are available over the counter (without a prescription).  Please call our office at [phone] for any questions or concerns.         Actinic keratoses are precancerous spots that appear secondary to cumulative UV radiation exposure/sun exposure over time. They are chronic with expected duration over 1 year. A portion of actinic keratoses will progress to squamous cell carcinoma of the skin. It is not possible to reliably predict which spots will progress to skin cancer and so treatment is recommended  to prevent development of skin cancer.  Recommend daily broad spectrum sunscreen SPF 30+ to sun-exposed areas, reapply every 2 hours as needed.  Recommend staying in the shade or wearing long sleeves, sun glasses (UVA+UVB protection) and wide brim hats (4-inch brim around the entire circumference of the hat). Call for new or changing lesions.       Cryotherapy Aftercare  Wash gently with soap and water everyday.   Apply Vaseline and Band-Aid daily until healed.      Seborrheic Keratosis  What causes seborrheic keratoses? Seborrheic keratoses are harmless, common skin growths that first appear during adult life.  As time goes by, more growths appear.  Some people may develop a large number of them.  Seborrheic keratoses appear   on both covered and uncovered body parts.  They are not caused by sunlight.  The tendency to develop seborrheic keratoses can be inherited.  They vary in color from skin-colored to gray, brown, or even black.  They can be either smooth or have a rough, warty surface.   Seborrheic keratoses are superficial and look as if they were stuck on the skin.  Under the microscope this type of keratosis looks like layers upon layers of skin.  That is why at times the top layer may seem to fall off, but the rest of the growth remains and re-grows.    Treatment Seborrheic keratoses do not need to be treated, but can easily be removed in the office.  Seborrheic keratoses often cause symptoms when they rub on clothing or jewelry.  Lesions can be in the way of shaving.  If they become inflamed, they can cause itching, soreness, or burning.  Removal of a seborrheic keratosis can be accomplished by freezing, burning, or surgery. If any spot bleeds, scabs, or grows rapidly, please return to have it checked, as these can be an indication of a skin cancer.   Melanoma ABCDEs  Melanoma is the most dangerous type of skin cancer, and is the leading cause of death from skin disease.  You  are more likely to develop melanoma if you: Have light-colored skin, light-colored eyes, or red or blond hair Spend a lot of time in the sun Tan regularly, either outdoors or in a tanning bed Have had blistering sunburns, especially during childhood Have a close family member who has had a melanoma Have atypical moles or large birthmarks  Early detection of melanoma is key since treatment is typically straightforward and cure rates are extremely high if we catch it early.   The first sign of melanoma is often a change in a mole or a new dark spot.  The ABCDE system is a way of remembering the signs of melanoma.  A for asymmetry:  The two halves do not match. B for border:  The edges of the growth are irregular. C for color:  A mixture of colors are present instead of an even brown color. D for diameter:  Melanomas are usually (but not always) greater than 6mm - the size of a pencil eraser. E for evolution:  The spot keeps changing in size, shape, and color.  Please check your skin once per month between visits. You can use a small mirror in front and a large mirror behind you to keep an eye on the back side or your body.   If you see any new or changing lesions before your next follow-up, please call to schedule a visit.  Please continue daily skin protection including broad spectrum sunscreen SPF 30+ to sun-exposed areas, reapplying every 2 hours as needed when you're outdoors.   Staying in the shade or wearing long sleeves, sun glasses (UVA+UVB protection) and wide brim hats (4-inch brim around the entire circumference of the hat) are also recommended for sun protection.    Due to recent changes in healthcare laws, you may see results of your pathology and/or laboratory studies on MyChart before the doctors have had a chance to review them. We understand that in some cases there may be results that are confusing or concerning to you. Please understand that not all results are received at  the same time and often the doctors may need to interpret multiple results in order to provide you with the best plan of   care or course of treatment. Therefore, we ask that you please give us 2 business days to thoroughly review all your results before contacting the office for clarification. Should we see a critical lab result, you will be contacted sooner.   If You Need Anything After Your Visit  If you have any questions or concerns for your doctor, please call our main line at [phone] and press option 4 to reach your doctor's medical assistant. If no one answers, please leave a voicemail as directed and we will return your call as soon as possible. Messages left after 4 pm will be answered the following business day.   You may also send us a message via MyChart. We typically respond to MyChart messages within 1-2 business days.  For prescription refills, please ask your pharmacy to contact our office. Our fax number is [phone].  If you have an urgent issue when the clinic is closed that cannot wait until the next business day, you can page your doctor at the number below.    Please note that while we do our best to be available for urgent issues outside of office hours, we are not available 24/7.   If you have an urgent issue and are unable to reach us, you may choose to seek medical care at your doctor's office, retail clinic, urgent care center, or emergency room.  If you have a medical emergency, please immediately call 911 or go to the emergency department.  Pager Numbers  - Dr. Kowalski: [phone]  - Dr. Moye: [phone]  - Dr. Stewart: [phone]  In the event of inclement weather, please call our main line at [phone] for an update on the status of any delays or closures.  Dermatology Medication Tips: Please keep the boxes that topical medications come in in order to help keep track of the instructions about where and how to use these. Pharmacies  typically print the medication instructions only on the boxes and not directly on the medication tubes.   If your medication is too expensive, please contact our office at [phone] option 4 or send us a message through MyChart.   We are unable to tell what your co-pay for medications will be in advance as this is different depending on your insurance coverage. However, we may be able to find a substitute medication at lower cost or fill out paperwork to get insurance to cover a needed medication.   If a prior authorization is required to get your medication covered by your insurance company, please allow us 1-2 business days to complete this process.  Drug prices often vary depending on where the prescription is filled and some pharmacies may offer cheaper prices.  The website www.goodrx.com contains coupons for medications through different pharmacies. The prices here do not account for what the cost may be with help from insurance (it may be cheaper with your insurance), but the website can give you the price if you did not use any insurance.  - You can print the associated coupon and take it with your prescription to the pharmacy.  - You may also stop by our office during regular business hours and pick up a GoodRx coupon card.  - If you need your prescription sent electronically to a different pharmacy, notify our office through [facility] MyChart or by phone at [phone] option 4.     Si Usted Necesita Algo Despus de Su Visita  Tambin puede enviarnos un mensaje a travs de MyChart. Por lo general respondemos   a los mensajes de MyChart en el transcurso de 1 a 2 das hbiles.  Para renovar recetas, por favor pida a su farmacia que se ponga en contacto con nuestra oficina. Nuestro nmero de fax es el [phone].  Si tiene un asunto urgente cuando la clnica est cerrada y que no puede esperar hasta el siguiente da hbil, puede llamar/localizar a su doctor(a) al nmero que  aparece a continuacin.   Por favor, tenga en cuenta que aunque hacemos todo lo posible para estar disponibles para asuntos urgentes fuera del horario de oficina, no estamos disponibles las 24 horas del da, los 7 das de la semana.   Si tiene un problema urgente y no puede comunicarse con nosotros, puede optar por buscar atencin mdica  en el consultorio de su doctor(a), en una clnica privada, en un centro de atencin urgente o en una sala de emergencias.  Si tiene una emergencia mdica, por favor llame inmediatamente al 911 o vaya a la sala de emergencias.  Nmeros de bper  - Dr. Kowalski: [phone]  - Dra. Moye: [phone]  - Dra. Stewart: [phone]  En caso de inclemencias del tiempo, por favor llame a nuestra lnea principal al [phone] para una actualizacin sobre el estado de cualquier retraso o cierre.  Consejos para la medicacin en dermatologa: Por favor, guarde las cajas en las que vienen los medicamentos de uso tpico para ayudarle a seguir las instrucciones sobre dnde y cmo usarlos. Las farmacias generalmente imprimen las instrucciones del medicamento slo en las cajas y no directamente en los tubos del medicamento.   Si su medicamento es muy caro, por favor, pngase en contacto con nuestra oficina llamando al [phone] y presione la opcin 4 o envenos un mensaje a travs de MyChart.   No podemos decirle cul ser su copago por los medicamentos por adelantado ya que esto es diferente dependiendo de la cobertura de su seguro. Sin embargo, es posible que podamos encontrar un medicamento sustituto a menor costo o llenar un formulario para que el seguro cubra el medicamento que se considera necesario.   Si se requiere una autorizacin previa para que su compaa de seguros cubra su medicamento, por favor permtanos de 1 a 2 das hbiles para completar este proceso.  Los precios de los medicamentos varan con frecuencia dependiendo del lugar de dnde se surte  la receta y alguna farmacias pueden ofrecer precios ms baratos.  El sitio web www.goodrx.com tiene cupones para medicamentos de diferentes farmacias. Los precios aqu no tienen en cuenta lo que podra costar con la ayuda del seguro (puede ser ms barato con su seguro), pero el sitio web puede darle el precio si no utiliz ningn seguro.  - Puede imprimir el cupn correspondiente y llevarlo con su receta a la farmacia.  - Tambin puede pasar por nuestra oficina durante el horario de atencin regular y recoger una tarjeta de cupones de GoodRx.  - Si necesita que su receta se enve electrnicamente a una farmacia diferente, informe a nuestra oficina a travs de MyChart de [facility] o por telfono llamando al [phone] y presione la opcin 4.  

## 2021-11-20 NOTE — Progress Notes (Unsigned)
New Patient Visit  Subjective  Joel Patterson is a 76 y.o. male who presents for the following: New Patient (Initial Visit) (Tbse , patient denies family or personal history of skin cancer. Concerned with spot at right side of cheek that is non healing, right neck that is irritated by necklace. ). The patient presents for Total-Body Skin Exam (TBSE) for skin cancer screening and mole check.  The patient has spots, moles and lesions to be evaluated, some may be new or changing and the patient has concerns that these could be cancer.  The following portions of the chart were reviewed this encounter and updated as appropriate:   Tobacco  Allergies  Meds  Problems  Med Hx  Surg Hx  Fam Hx     Review of Systems:  No other skin or systemic complaints except as noted in HPI or Assessment and Plan.  Objective  Well appearing patient in no apparent distress; mood and affect are within normal limits.  A full examination was performed including scalp, head, eyes, ears, nose, lips, neck, chest, axillae, abdomen, back, buttocks, bilateral upper extremities, bilateral lower extremities, hands, feet, fingers, toes, fingernails, and toenails. All findings within normal limits unless otherwise noted below.  right zygoma  1.6 cm crusted papule      right lateral neck x 1 Erythematous stuck-on, waxy papule or plaque  right temple x 1 Erythematous thin papules/macules with gritty scale.    Assessment & Plan  Neoplasm of uncertain behavior right zygoma  Epidermal / dermal shaving  Lesion diameter (cm):  1.6 Informed consent: discussed and consent obtained   Timeout: patient name, date of birth, surgical site, and procedure verified   Procedure prep:  Patient was prepped and draped in usual sterile fashion Prep type:  Isopropyl alcohol Anesthesia: the lesion was anesthetized in a standard fashion   Anesthetic:  1% lidocaine w/ epinephrine 1-100,000 buffered w/ 8.4% NaHCO3 Instrument used:  flexible razor blade   Hemostasis achieved with: pressure, aluminum chloride and electrodesiccation   Outcome: patient tolerated procedure well   Post-procedure details: sterile dressing applied and wound care instructions given   Dressing type: bandage and petrolatum    Destruction of lesion Complexity: extensive   Destruction method: electrodesiccation and curettage   Informed consent: discussed and consent obtained   Timeout:  patient name, date of birth, surgical site, and procedure verified Procedure prep:  Patient was prepped and draped in usual sterile fashion Prep type:  Isopropyl alcohol Anesthesia: the lesion was anesthetized in a standard fashion   Anesthetic:  1% lidocaine w/ epinephrine 1-100,000 buffered w/ 8.4% NaHCO3 Curettage performed in three different directions: Yes   Electrodesiccation performed over the curetted area: Yes   Lesion length (cm):  1.6 Lesion width (cm):  1.6 Margin per side (cm):  0.2 Final wound size (cm):  2 Hemostasis achieved with:  pressure, aluminum chloride and electrodesiccation Outcome: patient tolerated procedure well with no complications   Post-procedure details: sterile dressing applied and wound care instructions given   Dressing type: bandage and petrolatum    Specimen 1 - Surgical pathology Differential Diagnosis: r/o skin cancer   2 pieces in bottle  Check Margins: No  R/o sk cancer   Inflamed seborrheic keratosis right lateral neck x 1  Symptomatic, irritating, patient would like treated.  Destruction of lesion - right lateral neck x 1 Complexity: simple   Destruction method: cryotherapy   Informed consent: discussed and consent obtained   Timeout:  patient name, date  of birth, surgical site, and procedure verified Lesion destroyed using liquid nitrogen: Yes   Region frozen until ice ball extended beyond lesion: Yes   Outcome: patient tolerated procedure well with no complications   Post-procedure details: wound  care instructions given   Additional details:  Prior to procedure, discussed risks of blister formation, small wound, skin dyspigmentation, or rare scar following cryotherapy. Recommend Vaseline ointment to treated areas while healing.   Actinic keratosis right temple x 1  Actinic keratoses are precancerous spots that appear secondary to cumulative UV radiation exposure/sun exposure over time. They are chronic with expected duration over 1 year. A portion of actinic keratoses will progress to squamous cell carcinoma of the skin. It is not possible to reliably predict which spots will progress to skin cancer and so treatment is recommended to prevent development of skin cancer.  Recommend daily broad spectrum sunscreen SPF 30+ to sun-exposed areas, reapply every 2 hours as needed.  Recommend staying in the shade or wearing long sleeves, sun glasses (UVA+UVB protection) and wide brim hats (4-inch brim around the entire circumference of the hat). Call for new or changing lesions.  Destruction of lesion - right temple x 1 Complexity: simple   Destruction method: cryotherapy   Informed consent: discussed and consent obtained   Timeout:  patient name, date of birth, surgical site, and procedure verified Lesion destroyed using liquid nitrogen: Yes   Region frozen until ice ball extended beyond lesion: Yes   Outcome: patient tolerated procedure well with no complications   Post-procedure details: wound care instructions given   Additional details:  Prior to procedure, discussed risks of blister formation, small wound, skin dyspigmentation, or rare scar following cryotherapy. Recommend Vaseline ointment to treated areas while healing.   Skin cancer screening  Actinic skin damage  Lentigo  Melanocytic nevus, unspecified location  Seborrheic keratosis  Hemangioma of skin  Lentigines - Scattered tan macules - Due to sun exposure - Benign-appearing, observe - Recommend daily broad  spectrum sunscreen SPF 30+ to sun-exposed areas, reapply every 2 hours as needed. - Call for any changes  Seborrheic Keratoses - Stuck-on, waxy, tan-brown papules and/or plaques  - Benign-appearing - Discussed benign etiology and prognosis. - Observe - Call for any changes  Melanocytic Nevi - Tan-brown and/or pink-flesh-colored symmetric macules and papules - Benign appearing on exam today - Observation - Call clinic for new or changing moles - Recommend daily use of broad spectrum spf 30+ sunscreen to sun-exposed areas.   Hemangiomas - Red papules - Discussed benign nature - Observe - Call for any changes  Actinic Damage - Chronic condition, secondary to cumulative UV/sun exposure - diffuse scaly erythematous macules with underlying dyspigmentation - Recommend daily broad spectrum sunscreen SPF 30+ to sun-exposed areas, reapply every 2 hours as needed.  - Staying in the shade or wearing long sleeves, sun glasses (UVA+UVB protection) and wide brim hats (4-inch brim around the entire circumference of the hat) are also recommended for sun protection.  - Call for new or changing lesions.  Skin cancer screening performed today. Return in about 6 months (around 05/21/2022) for recheck ak at right cheek .  IRuthell Rummage, CMA, am acting as scribe for Sarina Ser, MD. Documentation: I have reviewed the above documentation for accuracy and completeness, and I agree with the above.  Sarina Ser, MD

## 2022-05-21 ENCOUNTER — Ambulatory Visit: Payer: Medicare Other | Admitting: Dermatology

## 2022-07-05 ENCOUNTER — Telehealth: Payer: Self-pay

## 2022-07-05 NOTE — Telephone Encounter (Signed)
Patient informed of pathology results 

## 2022-07-11 ENCOUNTER — Ambulatory Visit: Payer: Medicare Other | Admitting: Dermatology

## 2022-07-11 VITALS — BP 159/92 | HR 56

## 2022-07-11 DIAGNOSIS — L570 Actinic keratosis: Secondary | ICD-10-CM | POA: Diagnosis not present

## 2022-07-11 DIAGNOSIS — L578 Other skin changes due to chronic exposure to nonionizing radiation: Secondary | ICD-10-CM

## 2022-07-11 DIAGNOSIS — W908XXA Exposure to other nonionizing radiation, initial encounter: Secondary | ICD-10-CM

## 2022-07-11 DIAGNOSIS — Z85828 Personal history of other malignant neoplasm of skin: Secondary | ICD-10-CM

## 2022-07-11 DIAGNOSIS — D692 Other nonthrombocytopenic purpura: Secondary | ICD-10-CM

## 2022-07-11 DIAGNOSIS — X32XXXA Exposure to sunlight, initial encounter: Secondary | ICD-10-CM

## 2022-07-11 NOTE — Patient Instructions (Addendum)
Cryotherapy Aftercare  Wash gently with soap and water everyday.   Apply Vaseline and Band-Aid daily until healed.     Due to recent changes in healthcare laws, you may see results of your pathology and/or laboratory studies on MyChart before the doctors have had a chance to review them. We understand that in some cases there may be results that are confusing or concerning to you. Please understand that not all results are received at the same time and often the doctors may need to interpret multiple results in order to provide you with the best plan of care or course of treatment. Therefore, we ask that you please give us 2 business days to thoroughly review all your results before contacting the office for clarification. Should we see a critical lab result, you will be contacted sooner.   If You Need Anything After Your Visit  If you have any questions or concerns for your doctor, please call our main line at [phone] and press option 4 to reach your doctor's medical assistant. If no one answers, please leave a voicemail as directed and we will return your call as soon as possible. Messages left after 4 pm will be answered the following business day.   You may also send us a message via MyChart. We typically respond to MyChart messages within 1-2 business days.  For prescription refills, please ask your pharmacy to contact our office. Our fax number is [phone].  If you have an urgent issue when the clinic is closed that cannot wait until the next business day, you can page your doctor at the number below.    Please note that while we do our best to be available for urgent issues outside of office hours, we are not available 24/7.   If you have an urgent issue and are unable to reach us, you may choose to seek medical care at your doctor's office, retail clinic, urgent care center, or emergency room.  If you have a medical emergency, please immediately call 911 or go to the  emergency department.  Pager Numbers  - Dr. Kowalski: [phone]  - Dr. Moye: [phone]  - Dr. Stewart: [phone]  In the event of inclement weather, please call our main line at [phone] for an update on the status of any delays or closures.  Dermatology Medication Tips: Please keep the boxes that topical medications come in in order to help keep track of the instructions about where and how to use these. Pharmacies typically print the medication instructions only on the boxes and not directly on the medication tubes.   If your medication is too expensive, please contact our office at [phone] option 4 or send us a message through MyChart.   We are unable to tell what your co-pay for medications will be in advance as this is different depending on your insurance coverage. However, we may be able to find a substitute medication at lower cost or fill out paperwork to get insurance to cover a needed medication.   If a prior authorization is required to get your medication covered by your insurance company, please allow us 1-2 business days to complete this process.  Drug prices often vary depending on where the prescription is filled and some pharmacies may offer cheaper prices.  The website www.goodrx.com contains coupons for medications through different pharmacies. The prices here do not account for what the cost may be with help from insurance (it may be cheaper with your insurance), but the website can   give you the price if you did not use any insurance.  - You can print the associated coupon and take it with your prescription to the pharmacy.  - You may also stop by our office during regular business hours and pick up a GoodRx coupon card.  - If you need your prescription sent electronically to a different pharmacy, notify our office through [facility] MyChart or by phone at [phone] option 4.     Si Usted Necesita Algo Despus de Su Visita  Tambin puede  enviarnos un mensaje a travs de MyChart. Por lo general respondemos a los mensajes de MyChart en el transcurso de 1 a 2 das hbiles.  Para renovar recetas, por favor pida a su farmacia que se ponga en contacto con nuestra oficina. Nuestro nmero de fax es el [phone].  Si tiene un asunto urgente cuando la clnica est cerrada y que no puede esperar hasta el siguiente da hbil, puede llamar/localizar a su doctor(a) al nmero que aparece a continuacin.   Por favor, tenga en cuenta que aunque hacemos todo lo posible para estar disponibles para asuntos urgentes fuera del horario de oficina, no estamos disponibles las 24 horas del da, los 7 das de la semana.   Si tiene un problema urgente y no puede comunicarse con nosotros, puede optar por buscar atencin mdica  en el consultorio de su doctor(a), en una clnica privada, en un centro de atencin urgente o en una sala de emergencias.  Si tiene una emergencia mdica, por favor llame inmediatamente al 911 o vaya a la sala de emergencias.  Nmeros de bper  - Dr. Kowalski: [phone]  - Dra. Moye: [phone]  - Dra. Stewart: [phone]  En caso de inclemencias del tiempo, por favor llame a nuestra lnea principal al [phone] para una actualizacin sobre el estado de cualquier retraso o cierre.  Consejos para la medicacin en dermatologa: Por favor, guarde las cajas en las que vienen los medicamentos de uso tpico para ayudarle a seguir las instrucciones sobre dnde y cmo usarlos. Las farmacias generalmente imprimen las instrucciones del medicamento slo en las cajas y no directamente en los tubos del medicamento.   Si su medicamento es muy caro, por favor, pngase en contacto con nuestra oficina llamando al [phone] y presione la opcin 4 o envenos un mensaje a travs de MyChart.   No podemos decirle cul ser su copago por los medicamentos por adelantado ya que esto es diferente dependiendo de la cobertura de su seguro.  Sin embargo, es posible que podamos encontrar un medicamento sustituto a menor costo o llenar un formulario para que el seguro cubra el medicamento que se considera necesario.   Si se requiere una autorizacin previa para que su compaa de seguros cubra su medicamento, por favor permtanos de 1 a 2 das hbiles para completar este proceso.  Los precios de los medicamentos varan con frecuencia dependiendo del lugar de dnde se surte la receta y alguna farmacias pueden ofrecer precios ms baratos.  El sitio web www.goodrx.com tiene cupones para medicamentos de diferentes farmacias. Los precios aqu no tienen en cuenta lo que podra costar con la ayuda del seguro (puede ser ms barato con su seguro), pero el sitio web puede darle el precio si no utiliz ningn seguro.  - Puede imprimir el cupn correspondiente y llevarlo con su receta a la farmacia.  - Tambin puede pasar por nuestra oficina durante el horario de atencin regular y recoger una tarjeta de cupones de GoodRx.  -   Si necesita que su receta se enve electrnicamente a una farmacia diferente, informe a nuestra oficina a travs de MyChart de [facility] o por telfono llamando al [phone] y presione la opcin 4.  

## 2022-07-11 NOTE — Progress Notes (Signed)
Follow-Up Visit   Subjective  Joel Patterson is a 77 y.o. male who presents for the following: 6 months f/u on sun exposed skin,  hx of BCC,  The patient has spots, moles and lesions to be evaluated, some may be new or changing and the patient may have concern these could be cancer.  The following portions of the chart were reviewed this encounter and updated as appropriate: medications, allergies, medical history  Review of Systems:  No other skin or systemic complaints except as noted in HPI or Assessment and Plan.  Objective  Well appearing patient in no apparent distress; mood and affect are within normal limits. A focused examination was performed of the following areas:face, scalp,arms,hands  Relevant exam findings are noted in the Assessment and Plan.  face, scalp x 8 (8) Erythematous thin papules/macules with gritty scale.    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  - Call if any new or changing lesions are noted between office visits   AK (actinic keratosis) (8) face, scalp x 8  Actinic keratoses are precancerous spots that appear secondary to cumulative UV radiation exposure/sun exposure over time. They are chronic with expected duration over 1 year. A portion of actinic keratoses will progress to squamous cell carcinoma of the skin. It is not possible to reliably predict which spots will progress to skin cancer and so treatment is recommended to prevent development of skin cancer.  Recommend daily broad spectrum sunscreen SPF 30+ to sun-exposed areas, reapply every 2 hours as needed.  Recommend staying in the shade or wearing long sleeves, sun glasses (UVA+UVB protection) and wide brim hats (4-inch brim around the entire circumference of the hat). Call for  new or changing lesions.   Destruction of lesion - face, scalp x 8 Complexity: simple   Destruction method: cryotherapy   Informed consent: discussed and consent obtained   Timeout:  patient name, date of birth, surgical site, and procedure verified Lesion destroyed using liquid nitrogen: Yes   Region frozen until ice ball extended beyond lesion: Yes   Outcome: patient tolerated procedure well with no complications   Post-procedure details: wound care instructions given    Purpura - Chronic; persistent and recurrent.  Treatable, but not curable. - Violaceous macules and patches - Benign - Related to trauma, age, sun damage and/or use of blood thinners, chronic use of topical and/or oral steroids - Observe - Can use OTC arnica containing moisturizer such as Dermend Bruise Formula if desired - Call for worsening or other concerns   ACTINIC DAMAGE - chronic, secondary to cumulative UV radiation exposure/sun exposure over time - diffuse scaly erythematous macules with underlying dyspigmentation - Recommend daily broad spectrum sunscreen SPF 30+ to sun-exposed areas, reapply every 2 hours as needed.  - Recommend staying in the shade or wearing long sleeves, sun glasses (UVA+UVB protection) and wide brim hats (4-inch brim around the entire circumference of the hat). - Call for new or changing lesions.   Return in about 6 months (around 01/11/2023) for TBSE, hx of BCC, hx of AKs .  IAngelique Holm, CMA, am acting as scribe for Armida Sans, MD .   Documentation: I have reviewed the above documentation for accuracy and completeness, and I agree with the above.  Onalee Hua  Gwen Pounds, MD

## 2022-07-17 ENCOUNTER — Encounter: Payer: Self-pay | Admitting: Dermatology

## 2023-01-16 ENCOUNTER — Encounter: Payer: Self-pay | Admitting: Dermatology

## 2023-01-16 ENCOUNTER — Ambulatory Visit: Payer: Medicare Other | Admitting: Dermatology

## 2023-01-16 DIAGNOSIS — Z872 Personal history of diseases of the skin and subcutaneous tissue: Secondary | ICD-10-CM

## 2023-01-16 DIAGNOSIS — L814 Other melanin hyperpigmentation: Secondary | ICD-10-CM | POA: Diagnosis not present

## 2023-01-16 DIAGNOSIS — Z1283 Encounter for screening for malignant neoplasm of skin: Secondary | ICD-10-CM

## 2023-01-16 DIAGNOSIS — Z7189 Other specified counseling: Secondary | ICD-10-CM

## 2023-01-16 DIAGNOSIS — L820 Inflamed seborrheic keratosis: Secondary | ICD-10-CM | POA: Diagnosis not present

## 2023-01-16 DIAGNOSIS — L570 Actinic keratosis: Secondary | ICD-10-CM | POA: Diagnosis not present

## 2023-01-16 DIAGNOSIS — Z5111 Encounter for antineoplastic chemotherapy: Secondary | ICD-10-CM

## 2023-01-16 DIAGNOSIS — D1801 Hemangioma of skin and subcutaneous tissue: Secondary | ICD-10-CM

## 2023-01-16 DIAGNOSIS — W908XXA Exposure to other nonionizing radiation, initial encounter: Secondary | ICD-10-CM

## 2023-01-16 DIAGNOSIS — Z85828 Personal history of other malignant neoplasm of skin: Secondary | ICD-10-CM

## 2023-01-16 DIAGNOSIS — L578 Other skin changes due to chronic exposure to nonionizing radiation: Secondary | ICD-10-CM | POA: Diagnosis not present

## 2023-01-16 DIAGNOSIS — L821 Other seborrheic keratosis: Secondary | ICD-10-CM

## 2023-01-16 DIAGNOSIS — D692 Other nonthrombocytopenic purpura: Secondary | ICD-10-CM

## 2023-01-16 DIAGNOSIS — D229 Melanocytic nevi, unspecified: Secondary | ICD-10-CM

## 2023-01-16 DIAGNOSIS — Z79899 Other long term (current) drug therapy: Secondary | ICD-10-CM

## 2023-01-16 MED ORDER — FLUOROURACIL 5 % EX CREA: TOPICAL_CREAM | CUTANEOUS | 2 refills | Status: AC

## 2023-01-16 NOTE — Progress Notes (Signed)
Follow-Up Visit   Subjective  Joel Patterson is a 77 y.o. male who presents for the following: Skin Cancer Screening and Full Body Skin Exam. Hx of BCC. Hx of AKs.   6 month AK follow up. Tx with LN2 07/11/2022. Face and scalp.   The patient presents for Total-Body Skin Exam (TBSE) for skin cancer screening and mole check. The patient has spots, moles and lesions to be evaluated, some may be new or changing and the patient may have concern these could be cancer.    The following portions of the chart were reviewed this encounter and updated as appropriate: medications, allergies, medical history  Review of Systems:  No other skin or systemic complaints except as noted in HPI or Assessment and Plan.  Objective  Well appearing patient in no apparent distress; mood and affect are within normal limits.  A full examination was performed including scalp, head, eyes, ears, nose, lips, neck, chest, axillae, abdomen, back, buttocks, bilateral upper extremities, bilateral lower extremities, hands, feet, fingers, toes, fingernails, and toenails. All findings within normal limits unless otherwise noted below.   Relevant physical exam findings are noted in the Assessment and Plan.  face and scalp x17, nose x1 (18) Erythematous thin papules/macules with gritty scale.   R forehead x1 Erythematous keratotic or waxy stuck-on papule or plaque.    Assessment & Plan   HISTORY OF BASAL CELL CARCINOMA OF THE SKIN. Right zygoma. EDC 11/20/2021. - No evidence of recurrence today - Recommend regular full body skin exams - Recommend daily broad spectrum sunscreen SPF 30+ to sun-exposed areas, reapply every 2 hours as needed.  - Call if any new or changing lesions are noted between office visits   SKIN CANCER SCREENING PERFORMED TODAY.  ACTINIC DAMAGE WITH PRECANCEROUS ACTINIC KERATOSES Counseling for Topical Chemotherapy Management: Patient exhibits: - Severe, confluent actinic changes with  pre-cancerous actinic keratoses that is secondary to cumulative UV radiation exposure over time - Condition that is severe; chronic, not at goal. - diffuse scaly erythematous macules and papules with underlying dyspigmentation - Discussed Prescription "Field Treatment" topical Chemotherapy for Severe, Chronic Confluent Actinic Changes with Pre-Cancerous Actinic Keratoses Field treatment involves treatment of an entire area of skin that has confluent Actinic Changes (Sun/ Ultraviolet light damage) and PreCancerous Actinic Keratoses by method of PhotoDynamic Therapy (PDT) and/or prescription Topical Chemotherapy agents such as 5-fluorouracil, 5-fluorouracil/calcipotriene, and/or imiquimod.  The purpose is to decrease the number of clinically evident and subclinical PreCancerous lesions to prevent progression to development of skin cancer by chemically destroying early precancer changes that may or may not be visible.  It has been shown to reduce the risk of developing skin cancer in the treated area. As a result of treatment, redness, scaling, crusting, and open sores may occur during treatment course. One or more than one of these methods may be used and may have to be used several times to control, suppress and eliminate the PreCancerous changes. Discussed treatment course, expected reaction, and possible side effects. - Recommend daily broad spectrum sunscreen SPF 30+ to sun-exposed areas, reapply every 2 hours as needed.  - Staying in the shade or wearing long sleeves, sun glasses (UVA+UVB protection) and wide brim hats (4-inch brim around the entire circumference of the hat) are also recommended. - Call for new or changing lesions.  Apply 5-fluorouracil/calcipotriene cream twice a day for 7 days to affected areas including forehead and temples. Prescription sent to [facility]. Patient advised they will receive an  email to purchase the medication online and have it sent to their  home. Patient provided with handout reviewing treatment course and side effects and advised to call or message [place] on MyChart with any concerns.  Start the day after Thanksgiving.  Reviewed course of treatment and expected reaction.  Patient advised to expect inflammation and crusting and advised that erosions are possible.  Patient advised to be diligent with sun protection during and after treatment. Handout with details of how to apply medication and what to expect provided. Counseled to keep medication out of reach of children and pets.   LENTIGINES, SEBORRHEIC KERATOSES, HEMANGIOMAS - Benign normal skin lesions - Benign-appearing - Call for any changes  MELANOCYTIC NEVI - Tan-brown and/or pink-flesh-colored symmetric macules and papules - Benign appearing on exam today - Observation - Call clinic for new or changing moles - Recommend daily use of broad spectrum spf 30+ sunscreen to sun-exposed areas.    Purpura - Chronic; persistent and recurrent.  Treatable, but not curable. - Violaceous macules and patches at arms - Benign - Related to trauma, age, sun damage and/or use of blood thinners, chronic use of topical and/or oral steroids - Observe - Can use OTC arnica containing moisturizer such as Dermend Bruise Formula if desired - Call for worsening or other concerns    AK (actinic keratosis) (18) face and scalp x17, nose x1  Actinic keratoses are precancerous spots that appear secondary to cumulative UV radiation exposure/sun exposure over time. They are chronic with expected duration over 1 year. A portion of actinic keratoses will progress to squamous cell carcinoma of the skin. It is not possible to reliably predict which spots will progress to skin cancer and so treatment is recommended to prevent development of skin cancer.  Recommend daily broad spectrum sunscreen SPF 30+ to sun-exposed areas, reapply every 2 hours as needed.  Recommend staying in the shade or wearing long  sleeves, sun glasses (UVA+UVB protection) and wide brim hats (4-inch brim around the entire circumference of the hat). Call for new or changing lesions.  Destruction of lesion - face and scalp x17, nose x1 (18) Complexity: simple   Destruction method: cryotherapy   Informed consent: discussed and consent obtained   Timeout:  patient name, date of birth, surgical site, and procedure verified Lesion destroyed using liquid nitrogen: Yes   Region frozen until ice ball extended beyond lesion: Yes   Outcome: patient tolerated procedure well with no complications   Post-procedure details: wound care instructions given   Additional details:  Prior to procedure, discussed risks of blister formation, small wound, skin dyspigmentation, or rare scar following cryotherapy. Recommend Vaseline ointment to treated areas while healing.   Inflamed seborrheic keratosis R forehead x1  Symptomatic, irritating, patient would like treated.  Destruction of lesion - R forehead x1 Complexity: simple   Destruction method: cryotherapy   Informed consent: discussed and consent obtained   Timeout:  patient name, date of birth, surgical site, and procedure verified Lesion destroyed using liquid nitrogen: Yes   Region frozen until ice ball extended beyond lesion: Yes   Outcome: patient tolerated procedure well with no complications   Post-procedure details: wound care instructions given   Additional details:  Prior to procedure, discussed risks of blister formation, small wound, skin dyspigmentation, or rare scar following cryotherapy. Recommend Vaseline ointment to treated areas while healing.    Return in about 1 year (around 01/16/2024) for TBSE, AK Follow Up, HxBCC.  I, Lawson Radar, CMA, am acting as  scribe for Armida Sans, MD.   Documentation: I have reviewed the above documentation for accuracy and completeness, and I agree with the above.  Armida Sans, MD

## 2023-01-16 NOTE — Patient Instructions (Addendum)
Cryotherapy Aftercare  Wash gently with soap and water everyday.   Apply Vaseline Jelly daily until healed.     Start the day after Thanksgiving.  Apply 5-fluorouracil/calcipotriene cream twice a day for 7 days to affected areas including forehead and temples. Prescription sent to [facility]. Patient advised they will receive an email to purchase the medication online and have it sent to their home. Patient provided with handout reviewing treatment course and side effects and advised to call or message [place] on MyChart with any concerns.   Reviewed course of treatment and expected reaction.  Patient advised to expect inflammation and crusting and advised that erosions are possible.  Patient advised to be diligent with sun protection during and after treatment. Handout with details of how to apply medication and what to expect provided. Counseled to keep medication out of reach of children and pets.    Instructions for [facility]  One or more of your medications was sent to the [facility]. You will receive an email from them and can purchase the medicine through that link. It will then be mailed to your home at the address you confirmed. If for any reason you do not receive an email from them, please check your spam folder. If you still do not find the email, please let us know. [facility] phone number is [phone].     Recommend daily broad spectrum sunscreen SPF 30+ to sun-exposed areas, reapply every 2 hours as needed. Call for new or changing lesions.  Staying in the shade or wearing long sleeves, sun glasses (UVA+UVB protection) and wide brim hats (4-inch brim around the entire circumference of the hat) are also recommended for sun protection.     Melanoma ABCDEs  Melanoma is the most dangerous type of skin cancer, and is the leading cause of death from skin disease.  You are more likely to develop  melanoma if you: Have light-colored skin, light-colored eyes, or red or blond hair Spend a lot of time in the sun Tan regularly, either outdoors or in a tanning bed Have had blistering sunburns, especially during childhood Have a close family member who has had a melanoma Have atypical moles or large birthmarks  Early detection of melanoma is key since treatment is typically straightforward and cure rates are extremely high if we catch it early.   The first sign of melanoma is often a change in a mole or a new dark spot.  The ABCDE system is a way of remembering the signs of melanoma.  A for asymmetry:  The two halves do not match. B for border:  The edges of the growth are irregular. C for color:  A mixture of colors are present instead of an even brown color. D for diameter:  Melanomas are usually (but not always) greater than 6mm - the size of a pencil eraser. E for evolution:  The spot keeps changing in size, shape, and color.  Please check your skin once per month between visits. You can use a small mirror in front and a large mirror behind you to keep an eye on the back side or your body.   If you see any new or changing lesions before your next follow-up, please call to schedule a visit.  Please continue daily skin protection including broad spectrum sunscreen SPF 30+ to sun-exposed areas, reapplying every 2 hours as needed when you're outdoors.   Staying in the shade or wearing long sleeves,  sun glasses (UVA+UVB protection) and wide brim hats (4-inch brim around the entire circumference of the hat) are also recommended for sun protection.      Due to recent changes in healthcare laws, you may see results of your pathology and/or laboratory studies on MyChart before the doctors have had a chance to review them. We understand that in some cases there may be results that are confusing or concerning to you. Please understand that not all results are received at the same time and often  the doctors may need to interpret multiple results in order to provide you with the best plan of care or course of treatment. Therefore, we ask that you please give [place] 2 business days to thoroughly review all your results before contacting the office for clarification. Should we see a critical lab result, you will be contacted sooner.   If You Need Anything After Your Visit  If you have any questions or concerns for your doctor, please call our main line at [phone] and press option 4 to reach your doctor's medical assistant. If no one answers, please leave a voicemail as directed and we will return your call as soon as possible. Messages left after 4 pm will be answered the following business day.   You may also send [place] a message via MyChart. We typically respond to MyChart messages within 1-2 business days.  For prescription refills, please ask your pharmacy to contact our office. Our fax number is [phone].  If you have an urgent issue when the clinic is closed that cannot wait until the next business day, you can page your doctor at the number below.    Please note that while we do our best to be available for urgent issues outside of office hours, we are not available 24/7.   If you have an urgent issue and are unable to reach [place], you may choose to seek medical care at your doctor's office, retail clinic, urgent care center, or emergency room.  If you have a medical emergency, please immediately call 911 or go to the emergency department.  Pager Numbers  - Dr. Gwen Pounds: [phone]  - Dr. Roseanne Reno: [phone]  - Dr. Katrinka Blazing: [phone]   In the event of inclement weather, please call our main line at [phone] for an update on the status of any delays or closures.  Dermatology Medication Tips: Please keep the boxes that topical medications come in in order to help keep track of the instructions about where and how to use these. Pharmacies typically print the medication  instructions only on the boxes and not directly on the medication tubes.   If your medication is too expensive, please contact our office at [phone] option 4 or send [place] a message through MyChart.   We are unable to tell what your co-pay for medications will be in advance as this is different depending on your insurance coverage. However, we may be able to find a substitute medication at lower cost or fill out paperwork to get insurance to cover a needed medication.   If a prior authorization is required to get your medication covered by your insurance company, please allow [place] 1-2 business days to complete this process.  Drug prices often vary depending on where the prescription is filled and some pharmacies may offer cheaper prices.  The website www.goodrx.com contains coupons for medications through different pharmacies. The prices here do not account for what the cost may be with help from insurance (it may be cheaper  with your insurance), but the website can give you the price if you did not use any insurance.  - You can print the associated coupon and take it with your prescription to the pharmacy.  - You may also stop by our office during regular business hours and pick up a GoodRx coupon card.  - If you need your prescription sent electronically to a different pharmacy, notify our office through [facility] or by phone at [phone] option 4.     Si Usted Necesita Algo Despus de Su Visita  Tambin puede enviarnos un mensaje a travs de Clinical cytogeneticist. Por lo general respondemos a los mensajes de MyChart en el transcurso de 1 a 2 das hbiles.  Para renovar recetas, por favor pida a su farmacia que se ponga en contacto con nuestra oficina. Annie Sable de fax es [place] [phone].  Si tiene un asunto urgente cuando la clnica est cerrada y que no puede esperar hasta el siguiente da hbil, puede llamar/localizar a su doctor(a) al nmero que aparece a continuacin.   Por  favor, tenga en cuenta que aunque hacemos todo lo posible para estar disponibles para asuntos urgentes fuera del horario de [place], no estamos disponibles las 24 horas del da, los 7 [address]  [address] de la [place].   Si tiene un problema urgente y no puede comunicarse con nosotros, puede optar por buscar atencin mdica  en el consultorio de su doctor(a), en una clnica privada, en un centro de atencin urgente o en una sala de emergencias.  Si tiene Engineer, drilling, por favor llame inmediatamente al 911 o vaya a la sala de emergencias.  Nmeros de bper  - Dr. Gwen Pounds: [phone]  - Dra. Roseanne Reno: [phone]  - Dr. Katrinka Blazing: [phone]   En caso de inclemencias del tiempo, por favor llame a Lacy Duverney principal al [phone] para una actualizacin sobre el [place] de cualquier retraso o cierre.  Consejos para la medicacin en dermatologa: Por favor, guarde las cajas en las que vienen los medicamentos de uso tpico para ayudarle a seguir las instrucciones sobre dnde y cmo usarlos. Las farmacias generalmente imprimen las instrucciones del medicamento slo en las cajas y no directamente en los tubos del [place].   Si su medicamento es muy caro, por favor, pngase en contacto con Rolm Gala llamando al [phone] y presione la opcin 4 o envenos un mensaje a travs de Clinical cytogeneticist.   No podemos decirle cul ser su copago por los medicamentos por adelantado ya que esto es diferente dependiendo de la cobertura de su seguro. Sin embargo, es posible que podamos encontrar un medicamento sustituto a Audiological scientist un formulario para que el seguro cubra el medicamento que se considera necesario.   Si se requiere una autorizacin previa para que su compaa de seguros [place] su medicamento, por favor permtanos de 1 a 2 das hbiles para completar [address]  Los precios de los medicamentos varan con frecuencia dependiendo del Environmental consultant de dnde se surte la receta y alguna farmacias  pueden ofrecer precios ms baratos.  El sitio web www.goodrx.com tiene cupones para medicamentos de Health and safety inspector. Los precios aqu no tienen en cuenta lo que podra costar con la ayuda del seguro (puede ser ms barato con su seguro), pero el sitio web puede darle el precio si no utiliz Tourist information centre manager.  - Puede imprimir el cupn correspondiente y llevarlo con su receta a la farmacia.  - Tambin puede pasar por nuestra oficina durante el horario de atencin regular y Education officer, museum  una tarjeta de cupones de GoodRx.  - Si necesita que su receta se enve electrnicamente a una farmacia diferente, informe a nuestra oficina a travs de MyChart de [place] o por telfono llamando al [phone] y presione la opcin 4.

## 2024-01-16 ENCOUNTER — Encounter: Payer: Self-pay | Admitting: Dermatology

## 2024-01-16 ENCOUNTER — Ambulatory Visit: Payer: Medicare Other | Admitting: Dermatology

## 2024-01-16 DIAGNOSIS — D229 Melanocytic nevi, unspecified: Secondary | ICD-10-CM

## 2024-01-16 DIAGNOSIS — W908XXA Exposure to other nonionizing radiation, initial encounter: Secondary | ICD-10-CM

## 2024-01-16 DIAGNOSIS — L570 Actinic keratosis: Secondary | ICD-10-CM | POA: Diagnosis not present

## 2024-01-16 DIAGNOSIS — L821 Other seborrheic keratosis: Secondary | ICD-10-CM

## 2024-01-16 DIAGNOSIS — Z5111 Encounter for antineoplastic chemotherapy: Secondary | ICD-10-CM

## 2024-01-16 DIAGNOSIS — Z1283 Encounter for screening for malignant neoplasm of skin: Secondary | ICD-10-CM

## 2024-01-16 DIAGNOSIS — L578 Other skin changes due to chronic exposure to nonionizing radiation: Secondary | ICD-10-CM

## 2024-01-16 DIAGNOSIS — Q825 Congenital non-neoplastic nevus: Secondary | ICD-10-CM

## 2024-01-16 DIAGNOSIS — D692 Other nonthrombocytopenic purpura: Secondary | ICD-10-CM

## 2024-01-16 DIAGNOSIS — Z7189 Other specified counseling: Secondary | ICD-10-CM

## 2024-01-16 DIAGNOSIS — Z79899 Other long term (current) drug therapy: Secondary | ICD-10-CM

## 2024-01-16 DIAGNOSIS — Z85828 Personal history of other malignant neoplasm of skin: Secondary | ICD-10-CM

## 2024-01-16 DIAGNOSIS — L814 Other melanin hyperpigmentation: Secondary | ICD-10-CM

## 2024-01-16 NOTE — Patient Instructions (Addendum)
 Start treatment in January 2  - Start 5-fluorouracil /calcipotriene cream twice a day for 10 days to affected areas including top of scalp, forehead and temples, especially the hairline at the forehead and temples. Prescription sent to [facility]. Patient advised they will receive an email to purchase the medication online and have it sent to their home. Patient provided with handout reviewing treatment course and side effects and advised to call or message us  on MyChart with any concerns.  Reviewed course of treatment and expected reaction.  Patient advised to expect inflammation and crusting and advised that erosions are possible.  Patient advised to be diligent with sun protection during and after treatment. Counseled to keep medication out of reach of children and pets.   Instructions for [facility]  One or more of your medications was sent to the [facility] mail order [facility] pharmacy. You will receive an email from them and can purchase the medicine through that link. It will then be mailed to your home at the address you confirmed. If for any reason you do not receive an email from them, please check your spam folder. If you still do not find the email, please let us  know. [facility] phone number is [phone].   5-Fluorouracil /Calcipotriene Patient Education   Actinic keratoses are the dry, red scaly spots on the skin caused by sun damage. A portion of these spots can turn into skin cancer with time, and treating them can help prevent development of skin cancer.   Treatment of these spots requires removal of the defective skin cells. There are various ways to remove actinic keratoses, including freezing with liquid nitrogen, treatment with creams, or treatment with a blue light procedure in the office.   5-fluorouracil  cream is a topical cream used to treat actinic keratoses. It works by interfering with the growth of abnormal  fast-growing skin cells, such as actinic keratoses. These cells peel off and are replaced by healthy ones.   5-fluorouracil /calcipotriene is a combination of the 5-fluorouracil  cream with a vitamin D analog cream called calcipotriene. The calcipotriene alone does not treat actinic keratoses. However, when it is combined with 5-fluorouracil , it helps the 5-fluorouracil  treat the actinic keratoses much faster so that the same results can be achieved with a much shorter treatment time.  INSTRUCTIONS FOR 5-FLUOROURACIL /CALCIPOTRIENE CREAM:   5-fluorouracil /calcipotriene cream typically only needs to be used for 4-7 days. A thin layer should be applied twice a day to the treatment areas recommended by your physician.   If your physician prescribed you separate tubes of 5-fluourouracil and calcipotriene, apply a thin layer of 5-fluorouracil  followed by a thin layer of calcipotriene.   Avoid contact with your eyes, nostrils, and mouth. Do not use 5-fluorouracil /calcipotriene cream on infected or open wounds.   You will develop redness, irritation and some crusting at areas where you have pre-cancer damage/actinic keratoses. IF YOU DEVELOP PAIN, BLEEDING, OR SIGNIFICANT CRUSTING, STOP THE TREATMENT EARLY - you have already gotten a good response and the actinic keratoses should clear up well.  Wash your hands after applying 5-fluorouracil  5% cream on your skin.   A moisturizer or sunscreen with a minimum SPF 30 should be applied each morning.   Once you have finished the treatment, you can apply a thin layer of Vaseline twice a day to irritated areas to soothe and calm the areas more quickly. If you experience significant discomfort, contact your physician.  For some patients it is necessary to repeat the treatment for  best results.  SIDE EFFECTS: When using 5-fluorouracil /calcipotriene cream, you may have mild irritation, such as redness, dryness, swelling, or a mild burning sensation. This usually  resolves within 2 weeks. The more actinic keratoses you have, the more redness and inflammation you can expect during treatment. Eye irritation has been reported rarely. If this occurs, please let us  know.  If you have any trouble using this cream, please call the office. If you have any other questions about this information, please do not hesitate to ask me before you leave the office.    Actinic keratoses are precancerous spots that appear secondary to cumulative UV radiation exposure/sun exposure over time. They are chronic with expected duration over 1 year. A portion of actinic keratoses will progress to squamous cell carcinoma of the skin. It is not possible to reliably predict which spots will progress to skin cancer and so treatment is recommended to prevent development of skin cancer.  Recommend daily broad spectrum sunscreen SPF 30+ to sun-exposed areas, reapply every 2 hours as needed.  Recommend staying in the shade or wearing long sleeves, sun glasses (UVA+UVB protection) and wide brim hats (4-inch brim around the entire circumference of the hat). Call for new or changing lesions.    Cryotherapy Aftercare  Wash gently with soap and water everyday.   Apply Vaseline and Band-Aid daily until healed.    Melanoma ABCDEs  Melanoma is the most dangerous type of skin cancer, and is the leading cause of death from skin disease.  You are more likely to develop melanoma if you: Have light-colored skin, light-colored eyes, or red or blond hair Spend a lot of time in the sun Tan regularly, either outdoors or in a tanning bed Have had blistering sunburns, especially during childhood Have a close family member who has had a melanoma Have atypical moles or large birthmarks  Early detection of melanoma is key since treatment is typically straightforward and cure rates are extremely high if we catch it early.   The first sign of melanoma is often a change in a mole or a new dark spot.  The  ABCDE system is a way of remembering the signs of melanoma.  A for asymmetry:  The two halves do not match. B for border:  The edges of the growth are irregular. C for color:  A mixture of colors are present instead of an even brown color. D for diameter:  Melanomas are usually (but not always) greater than 6mm - the size of a pencil eraser. E for evolution:  The spot keeps changing in size, shape, and color.  Please check your skin once per month between visits. You can use a small mirror in front and a large mirror behind you to keep an eye on the back side or your body.   If you see any new or changing lesions before your next follow-up, please call to schedule a visit.  Please continue daily skin protection including broad spectrum sunscreen SPF 30+ to sun-exposed areas, reapplying every 2 hours as needed when you're outdoors.   Staying in the shade or wearing long sleeves, sun glasses (UVA+UVB protection) and wide brim hats (4-inch brim around the entire circumference of the hat) are also recommended for sun protection.    Due to recent changes in healthcare laws, you may see results of your pathology and/or laboratory studies on MyChart before the doctors have had a chance to review them. We understand that in some cases there may be results that  are confusing or concerning to you. Please understand that not all results are received at the same time and often the doctors may need to interpret multiple results in order to provide you with the best plan of care or course of treatment. Therefore, we ask that you please give us  2 business days to thoroughly review all your results before contacting the office for clarification. Should we see a critical lab result, you will be contacted sooner.   If You Need Anything After Your Visit  If you have any questions or concerns for your doctor, please call our main line at [phone] and press option 4 to reach your doctor's medical assistant. If  no one answers, please leave a voicemail as directed and we will return your call as soon as possible. Messages left after 4 pm will be answered the following business day.   You may also send us  a message via MyChart. We typically respond to MyChart messages within 1-2 business days.  For prescription refills, please ask your pharmacy to contact our office. Our fax number is [phone].  If you have an urgent issue when the clinic is closed that cannot wait until the next business day, you can page your doctor at the number below.    Please note that while we do our best to be available for urgent issues outside of office hours, we are not available 24/7.   If you have an urgent issue and are unable to reach us , you may choose to seek medical care at your doctor's office, retail clinic, urgent care center, or emergency room.  If you have a medical emergency, please immediately call 911 or go to the emergency department.  Pager Numbers  - Dr. Hester: [phone]  - Dr. Jackquline: [phone]  - Dr. Claudene: [phone]   - Dr. Raymund: [phone]  In the event of inclement weather, please call our main line at [phone] for an update on the status of any delays or closures.  Dermatology Medication Tips: Please keep the boxes that topical medications come in in order to help keep track of the instructions about where and how to use these. Pharmacies typically print the medication instructions only on the boxes and not directly on the medication tubes.   If your medication is too expensive, please contact our office at [phone] option 4 or send us  a message through MyChart.   We are unable to tell what your co-pay for medications will be in advance as this is different depending on your insurance coverage. However, we may be able to find a substitute medication at lower cost or fill out paperwork to get insurance to cover a needed medication.   If a prior authorization is  required to get your medication covered by your insurance company, please allow us  1-2 business days to complete this process.  Drug prices often vary depending on where the prescription is filled and some pharmacies may offer cheaper prices.  The website www.goodrx.com contains coupons for medications through different pharmacies. The prices here do not account for what the cost may be with help from insurance (it may be cheaper with your insurance), but the website can give you the price if you did not use any insurance.  - You can print the associated coupon and take it with your prescription to the pharmacy.  - You may also stop by our office during regular business hours and pick up a GoodRx coupon card.  - If you need your prescription sent  electronically to a different pharmacy, notify our office through [facility] or by phone at [phone] option 4.     Si Usted Necesita Algo Despus de Su Visita  Tambin puede enviarnos un mensaje a travs de Clinical Cytogeneticist. Por lo general respondemos a los mensajes de MyChart en el transcurso de 1 a 2 das hbiles.  Para renovar recetas, por favor pida a su farmacia que se ponga en contacto con nuestra oficina. Randi lakes de fax es [place] [phone].  Si tiene un asunto urgente cuando la clnica est cerrada y que no puede esperar hasta el siguiente da hbil, puede llamar/localizar a su doctor(a) al nmero que aparece a continuacin.   Por favor, tenga en cuenta que aunque hacemos todo lo posible para estar disponibles para asuntos urgentes fuera del horario de [place], no estamos disponibles las 24 horas del da, los 7 [address]  [phone] de la [place].   Si tiene un problema urgente y no puede comunicarse con nosotros, puede optar por buscar atencin mdica  en el consultorio de su doctor(a), en una clnica privada, en un centro de atencin urgente o en una sala de emergencias.  Si tiene engineer, drilling, por favor llame inmediatamente al 911 o vaya a  la sala de emergencias.  Nmeros de bper  - Dr. Hester: [phone]  - Dra. Jackquline: [phone]  - Dr. Claudene: [phone]  - Dra. Kitts: [phone]  En caso de inclemencias del [place], por favor llame a nuestra lnea principal al [phone] para una actualizacin sobre el estado de cualquier retraso o cierre.  Consejos para la medicacin en dermatologa: Por favor, guarde las cajas en las que vienen los medicamentos de uso tpico para ayudarle a seguir las instrucciones sobre dnde y cmo usarlos. Las farmacias generalmente imprimen las instrucciones del medicamento slo en las cajas y no directamente en los tubos del [place].   Si su medicamento es muy caro, por favor, pngase en contacto con landry rieger llamando al [phone] y presione la opcin 4 o envenos un mensaje a travs de Clinical Cytogeneticist.   No podemos decirle cul ser su copago por los medicamentos por adelantado ya que esto es diferente dependiendo de la cobertura de su seguro. Sin embargo, es posible que podamos encontrar un medicamento sustituto a audiological scientist un formulario para que el seguro cubra el medicamento que se considera necesario.   Si se requiere una autorizacin previa para que su compaa de seguros cubra su medicamento, por favor permtanos de 1 a 2 das hbiles para completar este proceso.  Los precios de los medicamentos varan con frecuencia dependiendo del environmental consultant de dnde se surte la receta y alguna farmacias pueden ofrecer precios ms baratos.  El sitio web www.goodrx.com tiene cupones para medicamentos de health and safety inspector. Los precios aqu no tienen en cuenta lo que podra costar con la ayuda del seguro (puede ser ms barato con su seguro), pero el sitio web puede darle el precio si no utiliz tourist information centre manager.  - Puede imprimir el cupn correspondiente y llevarlo con su receta a la farmacia.  - Tambin puede pasar por nuestra oficina durante el horario de atencin regular y education officer, museum  una tarjeta de cupones de GoodRx.  - Si necesita que su receta se enve electrnicamente a una farmacia diferente, informe a nuestra oficina a travs de MyChart de [place] o por telfono llamando al [phone] y presione la opcin 4.

## 2024-01-16 NOTE — Progress Notes (Signed)
 Follow-Up Visit   Subjective  Joel Patterson is a 78 y.o. male who presents for the following: Skin Cancer Screening and Full Body Skin Exam Hx of bcc and hx of ak Patient used 61f/u cream on forehead and temples last November   Restart cream forehead temple top of scalp   Send refill for cream   The patient presents for Total-Body Skin Exam (TBSE) for skin cancer screening and mole check. The patient has spots, moles and lesions to be evaluated, some may be new or changing and the patient may have concern these could be cancer.  The following portions of the chart were reviewed this encounter and updated as appropriate: medications, allergies, medical history  Review of Systems:  No other skin or systemic complaints except as noted in HPI or Assessment and Plan.  Objective  Well appearing patient in no apparent distress; mood and affect are within normal limits.  A full examination was performed including scalp, head, eyes, ears, nose, lips, neck, chest, axillae, abdomen, back, buttocks, bilateral upper extremities, bilateral lower extremities, hands, feet, fingers, toes, fingernails, and toenails. All findings within normal limits unless otherwise noted below.   Relevant physical exam findings are noted in the Assessment and Plan. Congenital Nevus right dorsal foot   Congenital Nevus right dorsal foot    face and scalp x 20 (20) Erythematous thin papules/macules with gritty scale.   Assessment & Plan    HISTORY OF BASAL CELL CARCINOMA OF THE SKIN 11/20/2021 right zygoma treated with ED&C - No evidence of recurrence today - Recommend regular full body skin exams - Recommend daily broad spectrum sunscreen SPF 30+ to sun-exposed areas, reapply every 2 hours as needed.  - Call if any new or changing lesions are noted between office visits  SKIN CANCER SCREENING PERFORMED TODAY.   LENTIGINES, SEBORRHEIC KERATOSES, HEMANGIOMAS - Benign normal skin lesions - Benign-appearing -  Call for any changes  MELANOCYTIC NEVI - Tan-brown and/or pink-flesh-colored symmetric macules and papules - Benign appearing on exam today - Observation - Call clinic for new or changing moles - Recommend daily use of broad spectrum spf 30+ sunscreen to sun-exposed areas.   Purpura - Chronic; persistent and recurrent.  Treatable, but not curable. - Violaceous macules and patches - Benign - Related to trauma, age, sun damage and/or use of blood thinners, chronic use of topical and/or oral steroids - Observe - Can use OTC arnica containing moisturizer such as Dermend Bruise Formula if desired - Call for worsening or other concerns  CONGENITAL NEVUS Exam: regular tan/brown papule/plaque, present since birth/childhood, no changes at right dorsal foot see photos Treatment Plan: Benign-appearing.  Stable. Observation.  Call clinic for new or changing moles.   Recommend daily use of broad spectrum spf 30+ sunscreen to sun-exposed areas.    ABCDEs of mole observation discussed and patient handout given.  RTC if any changes noted.   ACTINIC KERATOSIS (20) face and scalp x 20 (20) Restart cream January 2 - Start 5-fluorouracil /calcipotriene cream twice a day for 10 days to affected areas including top of scalp, forehead and temples, especially the hairline at the forehead and temples. Prescription sent to [facility]. Patient advised they will receive an email to purchase the medication online and have it sent to their home. Patient provided with handout reviewing treatment course and side effects and advised to call or message us  on MyChart with any concerns.  Reviewed course of treatment and expected reaction.  Patient advised to  expect inflammation and crusting and advised that erosions are possible.  Patient advised to be diligent with sun protection during and after treatment. Counseled to keep medication out of reach of children and pets.   ACTINIC DAMAGE WITH  PRECANCEROUS ACTINIC KERATOSES Counseling for Topical Chemotherapy Management: Patient exhibits: - Severe, confluent actinic changes with pre-cancerous actinic keratoses that is secondary to cumulative UV radiation exposure over time - Condition that is severe; chronic, not at goal. - diffuse scaly erythematous macules and papules with underlying dyspigmentation - Discussed Prescription Field Treatment topical Chemotherapy for Severe, Chronic Confluent Actinic Changes with Pre-Cancerous Actinic Keratoses Field treatment involves treatment of an entire area of skin that has confluent Actinic Changes (Sun/ Ultraviolet light damage) and PreCancerous Actinic Keratoses by method of PhotoDynamic Therapy (PDT) and/or prescription Topical Chemotherapy agents such as 5-fluorouracil , 5-fluorouracil /calcipotriene, and/or imiquimod.  The purpose is to decrease the number of clinically evident and subclinical PreCancerous lesions to prevent progression to development of skin cancer by chemically destroying early precancer changes that may or may not be visible.  It has been shown to reduce the risk of developing skin cancer in the treated area. As a result of treatment, redness, scaling, crusting, and open sores may occur during treatment course. One or more than one of these methods may be used and may have to be used several times to control, suppress and eliminate the PreCancerous changes. Discussed treatment course, expected reaction, and possible side effects. - Recommend daily broad spectrum sunscreen SPF 30+ to sun-exposed areas, reapply every 2 hours as needed.  - Staying in the shade or wearing long sleeves, sun glasses (UVA+UVB protection) and wide brim hats (4-inch brim around the entire circumference of the hat) are also recommended. - Call for new or changing lesions.  Actinic keratoses are precancerous spots that appear secondary to cumulative UV radiation exposure/sun exposure over time. They are  chronic with expected duration over 1 year. A portion of actinic keratoses will progress to squamous cell carcinoma of the skin. It is not possible to reliably predict which spots will progress to skin cancer and so treatment is recommended to prevent development of skin cancer.  Recommend daily broad spectrum sunscreen SPF 30+ to sun-exposed areas, reapply every 2 hours as needed.  Recommend staying in the shade or wearing long sleeves, sun glasses (UVA+UVB protection) and wide brim hats (4-inch brim around the entire circumference of the hat). Call for new or changing lesions. Destruction of lesion - face and scalp x 20 (20) Complexity: simple   Destruction method: cryotherapy   Informed consent: discussed and consent obtained   Timeout:  patient name, date of birth, surgical site, and procedure verified Lesion destroyed using liquid nitrogen: Yes   Region frozen until ice ball extended beyond lesion: Yes   Outcome: patient tolerated procedure well with no complications   Post-procedure details: wound care instructions given    Return in about 1 year (around 01/15/2025) for TBSE.  IEleanor Blush, CMA, am acting as scribe for Alm Rhyme, MD.   Documentation: I have reviewed the above documentation for accuracy and completeness, and I agree with the above.  Alm Rhyme, MD

## 2025-01-21 ENCOUNTER — Ambulatory Visit: Admitting: Dermatology
